# Patient Record
Sex: Male | Born: 1960 | Race: White | Hispanic: No | Marital: Married | Smoking: Former smoker
Health system: Southern US, Community
[De-identification: ages and names within clinical notes are randomized; demographics above are authoritative.]

## PROBLEM LIST (undated history)

## (undated) DIAGNOSIS — F411 Generalized anxiety disorder: Secondary | ICD-10-CM

## (undated) DIAGNOSIS — K760 Fatty (change of) liver, not elsewhere classified: Secondary | ICD-10-CM

## (undated) DIAGNOSIS — J189 Pneumonia, unspecified organism: Secondary | ICD-10-CM

## (undated) DIAGNOSIS — F17200 Nicotine dependence, unspecified, uncomplicated: Secondary | ICD-10-CM

## (undated) DIAGNOSIS — E785 Hyperlipidemia, unspecified: Secondary | ICD-10-CM

## (undated) DIAGNOSIS — J309 Allergic rhinitis, unspecified: Secondary | ICD-10-CM

## (undated) DIAGNOSIS — I2510 Atherosclerotic heart disease of native coronary artery without angina pectoris: Secondary | ICD-10-CM

## (undated) DIAGNOSIS — Z951 Presence of aortocoronary bypass graft: Secondary | ICD-10-CM

## (undated) DIAGNOSIS — I10 Essential (primary) hypertension: Secondary | ICD-10-CM

## (undated) DIAGNOSIS — I4510 Unspecified right bundle-branch block: Secondary | ICD-10-CM

## (undated) DIAGNOSIS — M5126 Other intervertebral disc displacement, lumbar region: Secondary | ICD-10-CM

## (undated) DIAGNOSIS — K219 Gastro-esophageal reflux disease without esophagitis: Secondary | ICD-10-CM

## (undated) HISTORY — DX: Generalized anxiety disorder: F41.1

## (undated) HISTORY — DX: Nicotine dependence, unspecified, uncomplicated: F17.200

## (undated) HISTORY — DX: Atherosclerotic heart disease of native coronary artery without angina pectoris: I25.10

## (undated) HISTORY — DX: Presence of aortocoronary bypass graft: Z95.1

## (undated) HISTORY — DX: Hyperlipidemia, unspecified: E78.5

## (undated) HISTORY — DX: Unspecified right bundle-branch block: I45.10

## (undated) HISTORY — DX: Allergic rhinitis, unspecified: J30.9

## (undated) HISTORY — PX: CARDIAC CATHETERIZATION: SHX172

## (undated) HISTORY — DX: Gastro-esophageal reflux disease without esophagitis: K21.9

---

## 2000-09-10 HISTORY — PX: CORONARY ARTERY BYPASS GRAFT: SHX141

## 2000-10-14 ENCOUNTER — Inpatient Hospital Stay (HOSPITAL_COMMUNITY): Admission: AD | Admit: 2000-10-14 | Discharge: 2000-10-20 | Payer: Self-pay | Admitting: Cardiovascular Disease

## 2000-10-14 ENCOUNTER — Encounter: Payer: Self-pay | Admitting: Cardiology

## 2000-10-15 ENCOUNTER — Encounter: Payer: Self-pay | Admitting: Thoracic Surgery (Cardiothoracic Vascular Surgery)

## 2000-10-16 ENCOUNTER — Encounter: Payer: Self-pay | Admitting: Cardiothoracic Surgery

## 2000-10-16 ENCOUNTER — Encounter: Payer: Self-pay | Admitting: Thoracic Surgery (Cardiothoracic Vascular Surgery)

## 2000-10-17 ENCOUNTER — Encounter: Payer: Self-pay | Admitting: Thoracic Surgery (Cardiothoracic Vascular Surgery)

## 2000-10-23 ENCOUNTER — Encounter: Payer: Self-pay | Admitting: Thoracic Surgery (Cardiothoracic Vascular Surgery)

## 2000-10-23 ENCOUNTER — Encounter
Admission: RE | Admit: 2000-10-23 | Discharge: 2000-10-23 | Payer: Self-pay | Admitting: Thoracic Surgery (Cardiothoracic Vascular Surgery)

## 2000-11-02 ENCOUNTER — Encounter: Payer: Self-pay | Admitting: Emergency Medicine

## 2000-11-02 ENCOUNTER — Inpatient Hospital Stay (HOSPITAL_COMMUNITY): Admission: EM | Admit: 2000-11-02 | Discharge: 2000-11-09 | Payer: Self-pay | Admitting: Emergency Medicine

## 2000-11-03 ENCOUNTER — Encounter: Payer: Self-pay | Admitting: Internal Medicine

## 2000-11-05 ENCOUNTER — Encounter: Payer: Self-pay | Admitting: Internal Medicine

## 2000-11-20 ENCOUNTER — Encounter
Admission: RE | Admit: 2000-11-20 | Discharge: 2000-11-20 | Payer: Self-pay | Admitting: Thoracic Surgery (Cardiothoracic Vascular Surgery)

## 2000-11-20 ENCOUNTER — Encounter: Payer: Self-pay | Admitting: Thoracic Surgery (Cardiothoracic Vascular Surgery)

## 2011-03-12 ENCOUNTER — Other Ambulatory Visit: Payer: Self-pay | Admitting: Medical

## 2011-04-11 ENCOUNTER — Other Ambulatory Visit: Payer: Self-pay | Admitting: Medical

## 2011-04-20 ENCOUNTER — Encounter: Payer: Self-pay | Admitting: Cardiology

## 2011-04-20 ENCOUNTER — Other Ambulatory Visit: Payer: Self-pay | Admitting: *Deleted

## 2011-04-23 ENCOUNTER — Ambulatory Visit: Payer: Self-pay | Admitting: Cardiology

## 2011-06-10 ENCOUNTER — Encounter: Payer: Self-pay | Admitting: Cardiology

## 2011-06-10 DIAGNOSIS — E785 Hyperlipidemia, unspecified: Secondary | ICD-10-CM | POA: Insufficient documentation

## 2011-06-10 DIAGNOSIS — F17200 Nicotine dependence, unspecified, uncomplicated: Secondary | ICD-10-CM | POA: Insufficient documentation

## 2011-06-10 DIAGNOSIS — K219 Gastro-esophageal reflux disease without esophagitis: Secondary | ICD-10-CM | POA: Insufficient documentation

## 2011-06-11 ENCOUNTER — Encounter: Payer: Self-pay | Admitting: Cardiology

## 2011-06-11 ENCOUNTER — Ambulatory Visit (INDEPENDENT_AMBULATORY_CARE_PROVIDER_SITE_OTHER): Payer: Medicaid Other | Admitting: Cardiology

## 2011-06-11 VITALS — BP 139/89 | HR 71 | Ht 74.0 in | Wt 296.0 lb

## 2011-06-11 DIAGNOSIS — E785 Hyperlipidemia, unspecified: Secondary | ICD-10-CM

## 2011-06-11 DIAGNOSIS — I2510 Atherosclerotic heart disease of native coronary artery without angina pectoris: Secondary | ICD-10-CM | POA: Insufficient documentation

## 2011-06-11 DIAGNOSIS — I4510 Unspecified right bundle-branch block: Secondary | ICD-10-CM

## 2011-06-11 DIAGNOSIS — Z951 Presence of aortocoronary bypass graft: Secondary | ICD-10-CM | POA: Insufficient documentation

## 2011-06-11 DIAGNOSIS — F17200 Nicotine dependence, unspecified, uncomplicated: Secondary | ICD-10-CM

## 2011-06-11 NOTE — Progress Notes (Signed)
HPI Patient is seen today to reestablish cardiology care.  The patient underwent cardiac catheterization in 2002.  He underwent bypass surgery by Dr. Dorris Fetch in 2002.  I have tried to find records.  I have located his hospital records from 2002.  One month after his CABG he had community acquired pneumonia.  He had some volume overload and these were both treated.  I have no records since that time.  In addition I have no EKG since that time.  Fortunately the patient has done very well.  He remains quite active.  His original symptom was a sensation of shortness of breath with a quivering feeling in his chest and headache.  He has not had symptoms of this sort since then.   Allergies  Allergen Reactions  . Erythromycin Shortness Of Breath  . Keflex Rash    Current Outpatient Prescriptions  Medication Sig Dispense Refill  . ALPRAZolam (XANAX) 1 MG tablet Take 1 mg by mouth 2 (two) times daily.        Marland Kitchen aspirin 325 MG tablet Take 325 mg by mouth daily.        . cetirizine (ZYRTEC) 10 MG tablet Take 10 mg by mouth daily.        . clopidogrel (PLAVIX) 75 MG tablet Take 75 mg by mouth daily.        Marland Kitchen docusate sodium (COLACE) 100 MG capsule Take 100 mg by mouth daily.        Marland Kitchen doxycycline (MONODOX) 50 MG capsule Take 50 mg by mouth daily.        Marland Kitchen esomeprazole (NEXIUM) 40 MG capsule Take 40 mg by mouth daily before breakfast.        . fluticasone (FLONASE) 50 MCG/ACT nasal spray Place 2 sprays into the nose daily.        . Hydrocodone-Acetaminophen (VICODIN HP) 10-660 MG TABS Take 1 tablet by mouth as directed.        . niacin 500 MG tablet Take 500 mg by mouth daily with breakfast.        . simvastatin (ZOCOR) 40 MG tablet Take 40 mg by mouth at bedtime.          History   Social History  . Marital Status: Single    Spouse Name: N/A    Number of Children: N/A  . Years of Education: N/A   Occupational History  . Not on file.   Social History Main Topics  . Smoking status: Current  Everyday Smoker -- 0.5 packs/day for 32 years    Types: Cigarettes  . Smokeless tobacco: Never Used   Comment: 50 to 60-pack year history of tobacco-STARTED BACK SMOKING  . Alcohol Use: No  . Drug Use: No  . Sexually Active: Not on file   Other Topics Concern  . Not on file   Social History Narrative  . No narrative on file    Family History  Problem Relation Age of Onset  . Heart attack Father 62  . Coronary artery disease      Brother and Sisters hx of CAD  . Cancer Mother     Breast Cancer    Past Medical History  Diagnosis Date  . Dyslipidemia   . CAD (coronary artery disease)   . Tobacco use disorder   . Anxiety state, unspecified   . Esophageal reflux   . Allergic rhinitis, cause unspecified   . Hx of CABG     2002, Dr. Dorris Fetch, X5  . RBBB (right  bundle branch block)     Noted October, 2012    Past Surgical History  Procedure Date  . Coronary artery bypass graft 2002    5 VESSEL   . Cardiac catheterization     ROS  Patient denies fever, chills, headache, sweats, rash, change in vision, change in hearing, chest pain, cough, nausea vomiting, urinary symptoms.  All other systems are reviewed and are negative.   PHYSICAL EXAM Patient is overweight.  He is here with his wife he.  He is oriented to person time and place.  Affect is normal.  Head is atraumatic.  There is no xanthelasma.  No carotid bruits.  There is no jugular venous distention.  Lungs are clear.  Respiratory effort is unlabored.  Cardiac reveals an S1-S2.  No clicks or significant murmurs.  The abdomen is soft.  There is no peripheral edema.  There is no musculoskeletal deformities.  No skin rashes. Filed Vitals:   06/11/11 1300  BP: 139/89  Pulse: 71  Height: 6\' 2"  (1.88 m)  Weight: 296 lb (134.265 kg)    EKG is done today and reviewed by me.  There is right bundle branch block.  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  There is no EKG for comparison. ASSESSMENT &  PLAN

## 2011-06-11 NOTE — Assessment & Plan Note (Signed)
The patient's coronary disease is stable.  He has no significant symptoms.  His surgery was done in 2002.  He has had no significant evaluation since then aware of.  He is on appropriate medications including aspirin and medications for his blood pressure and lipids.  Since it has been 10 years I have recommended to him that we at least reassess his LV function.  I have also recommended a carotid Doppler.  Since he feels fine he wants to have no tests at this time.

## 2011-06-11 NOTE — Assessment & Plan Note (Signed)
The patient's lipids are being treated.  I stressed to him how important it was to continue this treatment.  I be happy to see him back at any time.  I have recommended followup in one year.

## 2011-06-11 NOTE — Assessment & Plan Note (Signed)
I cannot document how long he had right bundle branch block.  This in itself is not an absolute indication for further evaluation

## 2011-06-11 NOTE — Patient Instructions (Signed)
Continue all current medications. Your physician wants you to follow up in:  1 year.  You will receive a reminder letter in the mail one-two months in advance.  If you don't receive a letter, please call our office to schedule the follow up appointment   

## 2011-06-11 NOTE — Assessment & Plan Note (Signed)
Patient continues to smoke. I have counseled him to stop. 

## 2011-07-12 ENCOUNTER — Other Ambulatory Visit: Payer: Self-pay | Admitting: Medical

## 2011-07-12 NOTE — Telephone Encounter (Signed)
Is this okay to refill? 

## 2012-03-13 ENCOUNTER — Encounter (HOSPITAL_COMMUNITY): Payer: Self-pay | Admitting: *Deleted

## 2012-03-13 ENCOUNTER — Emergency Department (HOSPITAL_COMMUNITY)
Admission: EM | Admit: 2012-03-13 | Discharge: 2012-03-13 | Disposition: A | Discharge status: Home / Self Care | Payer: Medicaid Other | Attending: Emergency Medicine | Admitting: Emergency Medicine

## 2012-03-13 ENCOUNTER — Emergency Department (HOSPITAL_COMMUNITY): Payer: Medicaid Other

## 2012-03-13 DIAGNOSIS — J440 Chronic obstructive pulmonary disease with acute lower respiratory infection: Secondary | ICD-10-CM | POA: Insufficient documentation

## 2012-03-13 DIAGNOSIS — E785 Hyperlipidemia, unspecified: Secondary | ICD-10-CM | POA: Insufficient documentation

## 2012-03-13 DIAGNOSIS — J441 Chronic obstructive pulmonary disease with (acute) exacerbation: Secondary | ICD-10-CM

## 2012-03-13 DIAGNOSIS — I2510 Atherosclerotic heart disease of native coronary artery without angina pectoris: Secondary | ICD-10-CM | POA: Insufficient documentation

## 2012-03-13 DIAGNOSIS — Z79899 Other long term (current) drug therapy: Secondary | ICD-10-CM | POA: Insufficient documentation

## 2012-03-13 DIAGNOSIS — F17200 Nicotine dependence, unspecified, uncomplicated: Secondary | ICD-10-CM | POA: Insufficient documentation

## 2012-03-13 DIAGNOSIS — J209 Acute bronchitis, unspecified: Secondary | ICD-10-CM | POA: Insufficient documentation

## 2012-03-13 HISTORY — DX: Pneumonia, unspecified organism: J18.9

## 2012-03-13 MED ORDER — IPRATROPIUM BROMIDE 0.02 % IN SOLN
0.5000 mg | Freq: Once | RESPIRATORY_TRACT | Status: AC
  Administered 2012-03-13: 0.5 mg via RESPIRATORY_TRACT
  Filled 2012-03-13: qty 2.5

## 2012-03-13 MED ORDER — PREDNISONE 10 MG PO TABS: ORAL_TABLET | ORAL | Status: DC

## 2012-03-13 MED ORDER — LEVOFLOXACIN 500 MG PO TABS: 500.0000 mg | ORAL_TABLET | Freq: Every day | ORAL | Status: AC

## 2012-03-13 MED ORDER — ALBUTEROL SULFATE HFA 108 (90 BASE) MCG/ACT IN AERS: 2.0000 | INHALATION_SPRAY | RESPIRATORY_TRACT | Status: DC | PRN

## 2012-03-13 MED ORDER — PREDNISONE 20 MG PO TABS
60.0000 mg | ORAL_TABLET | Freq: Once | ORAL | Status: AC
  Administered 2012-03-13: 60 mg via ORAL
  Filled 2012-03-13: qty 3

## 2012-03-13 MED ORDER — ALBUTEROL SULFATE (5 MG/ML) 0.5% IN NEBU
5.0000 mg | INHALATION_SOLUTION | Freq: Once | RESPIRATORY_TRACT | Status: AC
  Administered 2012-03-13: 5 mg via RESPIRATORY_TRACT
  Filled 2012-03-13: qty 1

## 2012-03-13 MED ORDER — AEROCHAMBER Z-STAT PLUS/MEDIUM MISC
1.0000 | Freq: Once | Status: AC
  Administered 2012-03-13: 1

## 2012-03-13 NOTE — ED Notes (Signed)
Pt c/o cough, congestion, wheezing, headache that started two days ago, cough is productive with green sputum production, denies any fever,

## 2012-03-13 NOTE — ED Provider Notes (Cosign Needed)
History    This chart was scribed for Ward Givens, MD, MD by Smitty Pluck. The patient was seen in room APA14 and the patient's care was started at 10:33AM.   CSN: [id number]  Arrival date & time 03/13/12  1008   First MD Initiated Contact with Patient 03/13/12 1016      Chief Complaint  Patient presents with  . Wheezing  . Cough    (Consider location/radiation/quality/duration/timing/severity/associated sxs/prior treatment) The history is provided by the patient.   Brandon Kaiser is a 51 y.o. male who presents to the Emergency Department complaining of moderate productive cough with green sputum onset 2 days ago. Pt reports rhinorrhea with yellow mucus. Symptoms have been constant since onset. There is no radiation. Denies sore throat, earache nausea, vomiting, diarrhea and fever. Pt reports he was on doxycycline due to conjunctivitis. He states he was put on doxycycline in the past for bronchitis and it didn't work.    Pt reports he is a smoker. Pt had by pass 2002.   Dr. Loney Hering in [facility]  Cardiologist is Dr. Stormy Card  Past Medical History  Diagnosis Date  . Dyslipidemia   . CAD (coronary artery disease)   . Tobacco use disorder   . Anxiety state, unspecified   . Esophageal reflux   . Allergic rhinitis, cause unspecified   . Hx of CABG     2002, Dr. Dorris Fetch, X5  . RBBB (right bundle branch block)     Noted October, 2012  . Pneumonia     Past Surgical History  Procedure Date  . Coronary artery bypass graft 2002    5 VESSEL   . Cardiac catheterization     Family History  Problem Relation Age of Onset  . Heart attack Father 51  . Coronary artery disease      Brother and Sisters hx of CAD  . Cancer Mother     Breast Cancer    History  Substance Use Topics  . Smoking status: Current Everyday Smoker -- 0.5 packs/day for 32 years    Types: Cigarettes  . Smokeless tobacco: Never Used   Comment: 50 to 60-pack year history of tobacco-STARTED BACK SMOKING  .  Alcohol Use: No   Lives with spouse On disability after CABG   Review of Systems  All other systems reviewed and are negative.  10 Systems reviewed and all are negative for acute change except as noted in the HPI.    Allergies  Erythromycin and Cephalexin  Home Medications   Current Outpatient Rx  Name Route Sig Dispense Refill  . ALPRAZOLAM 1 MG PO TABS Oral Take 1 mg by mouth 2 (two) times daily.      . ASPIRIN 325 MG PO TABS Oral Take 325 mg by mouth daily.      Marland Kitchen CETIRIZINE HCL 10 MG PO TABS Oral Take 10 mg by mouth daily.      Marland Kitchen CLOPIDOGREL BISULFATE 75 MG PO TABS Oral Take 75 mg by mouth daily.      Marland Kitchen DOCUSATE SODIUM 100 MG PO CAPS Oral Take 100 mg by mouth daily.      Marland Kitchen DOXYCYCLINE MONOHYDRATE 50 MG PO CAPS Oral Take 50 mg by mouth daily.      Marland Kitchen ESOMEPRAZOLE MAGNESIUM 40 MG PO CPDR Oral Take 40 mg by mouth daily before breakfast.      . FLUTICASONE PROPIONATE 50 MCG/ACT NA SUSP Nasal Place 2 sprays into the nose daily.      Marland Kitchen  HYDROCODONE-ACETAMINOPHEN 10-660 MG PO TABS Oral Take 1 tablet by mouth as directed.      Marland Kitchen NIACIN 500 MG PO TABS Oral Take 500 mg by mouth daily with breakfast.      . SIMVASTATIN 40 MG PO TABS Oral Take 40 mg by mouth at bedtime.        BP 141/79  Pulse 76  Temp 98.1 F (36.7 C)  Resp 20  Ht 6\' 3"  (1.905 m)  Wt 300 lb (136.079 kg)  BMI 37.50 kg/m2  SpO2 97%  Vital signs normal    Physical Exam  Nursing note and vitals reviewed. Constitutional: He is oriented to person, place, and time. He appears well-developed and well-nourished. No distress.  HENT:  Head: Normocephalic and atraumatic.  Right Ear: External ear normal.  Left Ear: External ear normal.  Nose: Nose normal.  Mouth/Throat: Oropharynx is clear and moist.  Eyes: Conjunctivae and EOM are normal. Pupils are equal, round, and reactive to light.  Neck: Normal range of motion. Neck supple.  Cardiovascular: Normal rate, regular rhythm and normal heart sounds.     Pulmonary/Chest: He has wheezes (expiratory diffusely ). He has no rales. He exhibits no tenderness.       No retractions   Neurological: He is alert and oriented to person, place, and time.  Skin: Skin is warm and dry.  Psychiatric: He has a normal mood and affect. His behavior is normal.    ED Course  Procedures (including critical care time)   Medications  aerochamber Z-Stat Plus/medium 1 each (not administered)  albuterol (PROVENTIL) (5 MG/ML) 0.5% nebulizer solution 5 mg (5 mg Nebulization Given 03/13/12 1059)  ipratropium (ATROVENT) nebulizer solution 0.5 mg (0.5 mg Nebulization Given 03/13/12 1059)  predniSONE (DELTASONE) tablet 60 mg (60 mg Oral Given 03/13/12 1047)  albuterol (PROVENTIL) (5 MG/ML) 0.5% nebulizer solution 5 mg (5 mg Nebulization Given 03/13/12 1149)  ipratropium (ATROVENT) nebulizer solution 0.5 mg (0.5 mg Nebulization Given 03/13/12 1149)     DIAGNOSTIC STUDIES: Oxygen Saturation is 97% on room air, normal by my interpretation.    COORDINATION OF CARE: 10:37AM EDP discusses pt ED treatment with pt Have discussed stopping smoking .  10:45AM EDP ordered medication: albuterol 5 mg/ml, atrovent 0.5 mg, deltasone 60 mg  11:43AM EDP rechecks pt. Pt reports breathing treatment has relieved symptoms. Pt has diffuse, lower pitch wheezing and rhonchi. EDP is ordering another breathing treatment and xray.   12:52PM EDP rechecks pt and discusses pt xray results. Pt has rare bronchi at bases. Pt walked to xray without SOB. Pt is ready for discharge.    Dg Chest 2 View  03/13/2012  *RADIOLOGY REPORT*  Clinical Data: Cough, congestion, wheezing  CHEST - 2 VIEW  Comparison: None.  Findings: No pneumonia is seen.  There is however peribronchial thickening which may indicate bronchitis.  The heart is mildly enlarged.  Median sternotomy sutures are noted from prior CABG  IMPRESSION: No pneumonia.  Question bronchitis.  Original Report Authenticated By: Juline Patch, M.D.       1. COPD with acute bronchitis   2. COPD exacerbation     New Prescriptions   ALBUTEROL (PROVENTIL HFA;VENTOLIN HFA) 108 (90 BASE) MCG/ACT INHALER    Inhale 2 puffs into the lungs every 4 (four) hours as needed for wheezing.   LEVOFLOXACIN (LEVAQUIN) 500 MG TABLET    Take 1 tablet (500 mg total) by mouth daily.   PREDNISONE (DELTASONE) 10 MG TABLET    Take 3 po QD  x 2d starting tomorrow, then 2 po QD x 3d then 1 po QD x 3d    Plan discharge  Devoria Albe, MD, FACEP   MDM   I personally performed the services described in this documentation, which was scribed in my presence. The recorded information has been reviewed and considered.  Devoria Albe, MD, Armando Gang    Ward Givens, MD 03/13/12 1321

## 2014-02-01 ENCOUNTER — Emergency Department (HOSPITAL_COMMUNITY)
Admission: EM | Admit: 2014-02-01 | Discharge: 2014-02-01 | Disposition: A | Discharge status: Home / Self Care | Payer: Medicaid Other | Attending: Emergency Medicine | Admitting: Emergency Medicine

## 2014-02-01 ENCOUNTER — Encounter (HOSPITAL_COMMUNITY): Payer: Self-pay | Admitting: Emergency Medicine

## 2014-02-01 DIAGNOSIS — Z8719 Personal history of other diseases of the digestive system: Secondary | ICD-10-CM | POA: Insufficient documentation

## 2014-02-01 DIAGNOSIS — Z8701 Personal history of pneumonia (recurrent): Secondary | ICD-10-CM | POA: Insufficient documentation

## 2014-02-01 DIAGNOSIS — J40 Bronchitis, not specified as acute or chronic: Secondary | ICD-10-CM

## 2014-02-01 DIAGNOSIS — IMO0002 Reserved for concepts with insufficient information to code with codable children: Secondary | ICD-10-CM | POA: Insufficient documentation

## 2014-02-01 DIAGNOSIS — Z951 Presence of aortocoronary bypass graft: Secondary | ICD-10-CM | POA: Insufficient documentation

## 2014-02-01 DIAGNOSIS — F411 Generalized anxiety disorder: Secondary | ICD-10-CM | POA: Insufficient documentation

## 2014-02-01 DIAGNOSIS — F17200 Nicotine dependence, unspecified, uncomplicated: Secondary | ICD-10-CM | POA: Insufficient documentation

## 2014-02-01 DIAGNOSIS — I2510 Atherosclerotic heart disease of native coronary artery without angina pectoris: Secondary | ICD-10-CM | POA: Insufficient documentation

## 2014-02-01 DIAGNOSIS — Z7982 Long term (current) use of aspirin: Secondary | ICD-10-CM | POA: Insufficient documentation

## 2014-02-01 DIAGNOSIS — J209 Acute bronchitis, unspecified: Secondary | ICD-10-CM | POA: Insufficient documentation

## 2014-02-01 DIAGNOSIS — J029 Acute pharyngitis, unspecified: Secondary | ICD-10-CM | POA: Insufficient documentation

## 2014-02-01 DIAGNOSIS — Z79899 Other long term (current) drug therapy: Secondary | ICD-10-CM | POA: Insufficient documentation

## 2014-02-01 DIAGNOSIS — M129 Arthropathy, unspecified: Secondary | ICD-10-CM | POA: Insufficient documentation

## 2014-02-01 DIAGNOSIS — I4510 Unspecified right bundle-branch block: Secondary | ICD-10-CM | POA: Insufficient documentation

## 2014-02-01 LAB — RAPID STREP SCREEN (MED CTR MEBANE ONLY): Streptococcus, Group A Screen (Direct): NEGATIVE

## 2014-02-01 MED ORDER — DOXYCYCLINE HYCLATE 100 MG PO TABS
100.0000 mg | ORAL_TABLET | Freq: Once | ORAL | Status: AC
  Administered 2014-02-01: 100 mg via ORAL
  Filled 2014-02-01: qty 1

## 2014-02-01 MED ORDER — PREDNISONE 10 MG PO TABS: 20.0000 mg | ORAL_TABLET | Freq: Every day | ORAL | Status: DC

## 2014-02-01 MED ORDER — DOXYCYCLINE HYCLATE 100 MG PO CAPS: 100.0000 mg | ORAL_CAPSULE | Freq: Two times a day (BID) | ORAL | Status: DC

## 2014-02-01 MED ORDER — ALBUTEROL SULFATE HFA 108 (90 BASE) MCG/ACT IN AERS
2.0000 | INHALATION_SPRAY | Freq: Once | RESPIRATORY_TRACT | Status: AC
  Administered 2014-02-01: 2 via RESPIRATORY_TRACT
  Filled 2014-02-01: qty 6.7

## 2014-02-01 NOTE — ED Notes (Signed)
Sore throat, cough with clear sputum since Saturday, "low grade fever".

## 2014-02-01 NOTE — Discharge Instructions (Signed)
Please increase fluids. Please use salt water gargles for your throat. Please use the mask until symptoms have resolved to prevent spread of the infection. It is very important that you stop smoking. Please use albuterol 2 puffs every 4 hours. Please use doxycycline 2 times daily until all taken. Tylenol or ibuprofen every 4 hours for fever or aching. Use the prednisone taper as prescribed. Please see your primary physician for recheck, or return to the emergency department  Bronchitis Bronchitis is swelling (inflammation) of the air tubes leading to your lungs (bronchi). This causes mucus and a cough. If the swelling gets bad, you may have trouble breathing. HOME CARE   Rest.  Drink enough fluids to keep your pee (urine) clear or pale yellow (unless you have a condition where you have to watch how much you drink).  Only take medicine as told by your doctor. If you were given antibiotic medicines, finish them even if you start to feel better.  Avoid smoke, irritating chemicals, and strong smells. These make the problem worse. Quit smoking if you smoke. This helps your lungs heal faster.  Use a cool mist humidifier. Change the water in the humidifier every day. You can also sit in the bathroom with hot shower running for 5 10 minutes. Keep the door closed.  See your health care provider as told.  Wash your hands often. GET HELP IF: Your problems do not get better after 1 week. GET HELP RIGHT AWAY IF:   Your fever gets worse.  You have chills.  Your chest hurts.  Your problems breathing get worse.  You have blood in your mucus.  You pass out (faint).  You feel lightheaded.  You have a bad headache.  You throw up (vomit) again and again. MAKE SURE YOU:  Understand these instructions.  Will watch your condition.  Will get help right away if you are not doing well or get worse. Document Released: 02/13/2008 Document Revised: 06/17/2013 Document Reviewed: 04/21/2013 [facility]  Patient Information 2014 [place], [place].  Pharyngitis Pharyngitis is a sore throat (pharynx). There is redness, pain, and swelling of your throat. HOME CARE   Drink enough fluids to keep your pee (urine) clear or pale yellow.  Only take medicine as told by your doctor.  You may get sick again if you do not take medicine as told. Finish your medicines, even if you start to feel better.  Do not take aspirin.  Rest.  Rinse your mouth (gargle) with salt water ( tsp of salt per 1 qt of water) every 1 2 hours. This will help the pain.  If you are not at risk for choking, you can suck on hard candy or sore throat lozenges. GET HELP IF:  You have large, tender lumps on your neck.  You have a rash.  You cough up green, yellow-brown, or bloody spit. GET HELP RIGHT AWAY IF:   You have a stiff neck.  You drool or cannot swallow liquids.  You throw up (vomit) or are not able to keep medicine or liquids down.  You have very bad pain that does not go away with medicine.  You have problems breathing (not from a stuffy nose). MAKE SURE YOU:   Understand these instructions.  Will watch your condition.  Will get help right away if you are not doing well or get worse. Document Released: 02/13/2008 Document Revised: 06/17/2013 Document Reviewed: 05/04/2013 [facility] Patient Information 2014 [place], [place]. if not improving

## 2014-02-01 NOTE — ED Provider Notes (Signed)
CSN: [id number]     Arrival date & time 02/01/14  1107 History  This chart was scribed for non-physician practitioner Lyla Son. Danae Orleans,  working with Glynn Octave, MD, by Yevette Edwards, ED Scribe. This patient was seen in room APFT23/APFT23 and the patient's care was started at 2:20 PM.   None    Chief Complaint  Patient presents with  . Sore Throat    The history is provided by the patient. No language interpreter was used.   HPI Comments: Brandon Kaiser is a 53 y.o. male who presents to the Emergency Department complaining of a sore throat which has persisted for three days and has been associated with a cough productive of clear sputum and a low-grade fever. In the ED, the pt's temperature is 99 F. He is able to drink normally. He denies a h/o operations or procedures to his throat. He has a h/o pneumonia. The pt is a current smoker.   Past Medical History  Diagnosis Date  . Dyslipidemia   . CAD (coronary artery disease)   . Tobacco use disorder   . Anxiety state, unspecified   . Esophageal reflux   . Allergic rhinitis, cause unspecified   . Hx of CABG     2002, Dr. Dorris Fetch, X5  . RBBB (right bundle branch block)     Noted October, 2012  . Pneumonia    Past Surgical History  Procedure Laterality Date  . Coronary artery bypass graft  2002    5 VESSEL   . Cardiac catheterization     Family History  Problem Relation Age of Onset  . Heart attack Father 3  . Coronary artery disease      Brother and Sisters hx of CAD  . Cancer Mother     Breast Cancer   History  Substance Use Topics  . Smoking status: Current Every Day Smoker -- 0.50 packs/day for 32 years    Types: Cigarettes  . Smokeless tobacco: Never Used     Comment: 50 to 60-pack year history of tobacco-STARTED BACK SMOKING  . Alcohol Use: No    Review of Systems  Constitutional: Positive for fever.  HENT: Positive for sore throat.   Respiratory: Positive for cough.   All other systems reviewed  and are negative.  Allergies  Erythromycin and Cephalexin  Home Medications   Prior to Admission medications   Medication Sig Start Date End Date Taking? Authorizing Provider  albuterol (PROVENTIL HFA;VENTOLIN HFA) 108 (90 BASE) MCG/ACT inhaler Inhale 2 puffs into the lungs every 4 (four) hours as needed for wheezing. 03/13/12 03/13/13  Ward Givens, MD  ALPRAZolam Prudy Feeler) 1 MG tablet Take 1 mg by mouth 2 (two) times daily.      Historical Provider, MD  aspirin 325 MG tablet Take 325 mg by mouth daily.      Historical Provider, MD  Aspirin-Acetaminophen-Caffeine (GOODYS EXTRA STRENGTH) [phone] MG PACK Take 1 packet by mouth every 6 (six) hours as needed. Pain    Historical Provider, MD  cetirizine (ZYRTEC) 10 MG tablet Take 10 mg by mouth daily.      Historical Provider, MD  clopidogrel (PLAVIX) 75 MG tablet Take 75 mg by mouth daily.      Historical Provider, MD  docusate sodium (COLACE) 100 MG capsule Take 100 mg by mouth daily.      Historical Provider, MD  fluticasone (FLONASE) 50 MCG/ACT nasal spray Place 1 spray into the nose daily.     Historical Provider, MD  Hydrocodone-Acetaminophen (VICODIN HP) 10-660 MG TABS Take 0.5 tablets by mouth 4 (four) times daily.     Historical Provider, MD  niacin 500 MG tablet Take 500 mg by mouth daily with breakfast.      Historical Provider, MD  Olopatadine HCl (PATADAY) 0.2 % SOLN Place 1 drop into both eyes daily.    Historical Provider, MD  predniSONE (DELTASONE) 10 MG tablet Take 3 po QD x 2d starting tomorrow, then 2 po QD x 3d then 1 po QD x 3d 03/13/12   Ward GivensIva L Knapp, MD  simvastatin (ZOCOR) 40 MG tablet Take 40 mg by mouth at bedtime.      Historical Provider, MD   Triage Vitals: BP 137/76  Pulse 78  Temp(Src) 99 F (37.2 C) (Oral)  Resp 18  Ht 6' (1.829 m)  Wt 304 lb (137.893 kg)  BMI 41.22 kg/m2  SpO2 97%  Physical Exam  Nursing note and vitals reviewed. Constitutional: He is oriented to person, place, and time. He appears  well-developed and well-nourished. No distress.  HENT:  Head: Normocephalic and atraumatic.  Right Ear: Tympanic membrane and external ear normal.  Left Ear: Tympanic membrane and external ear normal.  Uvula is enlarged and midline. Increased redness to pharynx.  Airway is patent.  No pain to percussion over the sinuses.  Mild nasal congestion.   Eyes: EOM are normal.  Neck: Neck supple. No tracheal deviation present.  Trachea midline.   Cardiovascular: Normal rate, regular rhythm and normal heart sounds.   No murmur heard. Pulmonary/Chest: Effort normal. No respiratory distress. He has wheezes.  Bilateral rhonchi. Expiratory wheezes.   Musculoskeletal: Normal range of motion.  Neurological: He is alert and oriented to person, place, and time.  Skin: Skin is warm and dry.  Psychiatric: He has a normal mood and affect. His behavior is normal.    ED Course  Procedures (including critical care time)  DIAGNOSTIC STUDIES: Oxygen Saturation is 97% on room air, normal by my interpretation.    COORDINATION OF CARE:  2:25 PM- Discussed treatment plan with patient, and the patient agreed to the plan. The plan includes an antibiotic prescription and a steroid treatment. Advised the pt to cease smoking.   Labs Review Labs Reviewed  RAPID STREP SCREEN  CULTURE, GROUP A STREP    Imaging Review No results found.   EKG Interpretation None      MDM Pt noted to have bilat wheezes/rhonchi and increase redness of the throat. Rx doxycycline and prednisone given. Pt to use salt water gargles. Pt will return if any changes or problem.     Final diagnoses:  None    **I have reviewed nursing notes, vital signs, and all appropriate lab and imaging results for this patient.*  **I personally performed the services described in this documentation, which was scribed in my presence. The recorded information has been reviewed and is accurate.Kathie Dike*   Jhordan Mckibben M Nary Sneed, PA-C 02/01/14 1818

## 2014-02-01 NOTE — ED Notes (Signed)
Pt c/o productive cough with thin, clear, sputum, congestion, and sore throat for last few days. Throat red with some patchy white areas noted.

## 2014-02-02 NOTE — ED Provider Notes (Signed)
Medical screening examination/treatment/procedure(s) were performed by non-physician practitioner and as supervising physician I was immediately available for consultation/collaboration.   EKG Interpretation None        Zillah Alexie, MD 02/02/14 0705 

## 2014-02-03 LAB — CULTURE, GROUP A STREP

## 2014-03-03 ENCOUNTER — Inpatient Hospital Stay (HOSPITAL_COMMUNITY)
Admission: EM | Admit: 2014-03-03 | Discharge: 2014-03-07 | DRG: 604 | Disposition: A | Discharge status: Home / Self Care | Payer: Medicaid Other | Attending: Internal Medicine | Admitting: Internal Medicine

## 2014-03-03 ENCOUNTER — Encounter (HOSPITAL_COMMUNITY): Payer: Self-pay | Admitting: Emergency Medicine

## 2014-03-03 ENCOUNTER — Emergency Department (HOSPITAL_COMMUNITY): Payer: Medicaid Other

## 2014-03-03 DIAGNOSIS — K760 Fatty (change of) liver, not elsewhere classified: Secondary | ICD-10-CM

## 2014-03-03 DIAGNOSIS — T481X5A Adverse effect of skeletal muscle relaxants [neuromuscular blocking agents], initial encounter: Secondary | ICD-10-CM | POA: Diagnosis not present

## 2014-03-03 DIAGNOSIS — I25118 Atherosclerotic heart disease of native coronary artery with other forms of angina pectoris: Secondary | ICD-10-CM

## 2014-03-03 DIAGNOSIS — J9600 Acute respiratory failure, unspecified whether with hypoxia or hypercapnia: Secondary | ICD-10-CM | POA: Diagnosis not present

## 2014-03-03 DIAGNOSIS — I2583 Coronary atherosclerosis due to lipid rich plaque: Secondary | ICD-10-CM

## 2014-03-03 DIAGNOSIS — M519 Unspecified thoracic, thoracolumbar and lumbosacral intervertebral disc disorder: Secondary | ICD-10-CM | POA: Diagnosis present

## 2014-03-03 DIAGNOSIS — E860 Dehydration: Secondary | ICD-10-CM | POA: Diagnosis present

## 2014-03-03 DIAGNOSIS — Z7982 Long term (current) use of aspirin: Secondary | ICD-10-CM

## 2014-03-03 DIAGNOSIS — F17200 Nicotine dependence, unspecified, uncomplicated: Secondary | ICD-10-CM | POA: Diagnosis present

## 2014-03-03 DIAGNOSIS — N179 Acute kidney failure, unspecified: Secondary | ICD-10-CM | POA: Diagnosis present

## 2014-03-03 DIAGNOSIS — X58XXXA Exposure to other specified factors, initial encounter: Secondary | ICD-10-CM

## 2014-03-03 DIAGNOSIS — Z951 Presence of aortocoronary bypass graft: Secondary | ICD-10-CM

## 2014-03-03 DIAGNOSIS — J441 Chronic obstructive pulmonary disease with (acute) exacerbation: Secondary | ICD-10-CM | POA: Diagnosis not present

## 2014-03-03 DIAGNOSIS — S301XXA Contusion of abdominal wall, initial encounter: Secondary | ICD-10-CM

## 2014-03-03 DIAGNOSIS — M5136 Other intervertebral disc degeneration, lumbar region: Secondary | ICD-10-CM

## 2014-03-03 DIAGNOSIS — K7689 Other specified diseases of liver: Secondary | ICD-10-CM | POA: Diagnosis present

## 2014-03-03 DIAGNOSIS — S301XXS Contusion of abdominal wall, sequela: Secondary | ICD-10-CM

## 2014-03-03 DIAGNOSIS — R109 Unspecified abdominal pain: Secondary | ICD-10-CM

## 2014-03-03 DIAGNOSIS — F411 Generalized anxiety disorder: Secondary | ICD-10-CM | POA: Diagnosis present

## 2014-03-03 DIAGNOSIS — Z6838 Body mass index (BMI) 38.0-38.9, adult: Secondary | ICD-10-CM

## 2014-03-03 DIAGNOSIS — K219 Gastro-esophageal reflux disease without esophagitis: Secondary | ICD-10-CM | POA: Diagnosis present

## 2014-03-03 DIAGNOSIS — E785 Hyperlipidemia, unspecified: Secondary | ICD-10-CM | POA: Diagnosis present

## 2014-03-03 DIAGNOSIS — I2510 Atherosclerotic heart disease of native coronary artery without angina pectoris: Secondary | ICD-10-CM | POA: Diagnosis present

## 2014-03-03 DIAGNOSIS — Z7902 Long term (current) use of antithrombotics/antiplatelets: Secondary | ICD-10-CM

## 2014-03-03 DIAGNOSIS — M62838 Other muscle spasm: Secondary | ICD-10-CM | POA: Diagnosis present

## 2014-03-03 DIAGNOSIS — I10 Essential (primary) hypertension: Secondary | ICD-10-CM | POA: Diagnosis present

## 2014-03-03 DIAGNOSIS — T148XXA Other injury of unspecified body region, initial encounter: Secondary | ICD-10-CM

## 2014-03-03 DIAGNOSIS — Z8249 Family history of ischemic heart disease and other diseases of the circulatory system: Secondary | ICD-10-CM

## 2014-03-03 DIAGNOSIS — M5126 Other intervertebral disc displacement, lumbar region: Secondary | ICD-10-CM | POA: Diagnosis present

## 2014-03-03 DIAGNOSIS — Z79899 Other long term (current) drug therapy: Secondary | ICD-10-CM

## 2014-03-03 DIAGNOSIS — Z803 Family history of malignant neoplasm of breast: Secondary | ICD-10-CM

## 2014-03-03 DIAGNOSIS — E669 Obesity, unspecified: Secondary | ICD-10-CM | POA: Diagnosis present

## 2014-03-03 DIAGNOSIS — I4510 Unspecified right bundle-branch block: Secondary | ICD-10-CM

## 2014-03-03 HISTORY — DX: Essential (primary) hypertension: I10

## 2014-03-03 HISTORY — DX: Other intervertebral disc displacement, lumbar region: M51.26

## 2014-03-03 HISTORY — DX: Fatty (change of) liver, not elsewhere classified: K76.0

## 2014-03-03 LAB — CBC WITH DIFFERENTIAL/PLATELET
BASOS ABS: 0 10*3/uL (ref 0.0–0.1)
Basophils Relative: 0 % (ref 0–1)
Eosinophils Absolute: 0 10*3/uL (ref 0.0–0.7)
Eosinophils Relative: 0 % (ref 0–5)
HEMATOCRIT: 46.8 % (ref 39.0–52.0)
HEMOGLOBIN: 15.7 g/dL (ref 13.0–17.0)
LYMPHS PCT: 12 % (ref 12–46)
Lymphs Abs: 1.4 10*3/uL (ref 0.7–4.0)
MCH: 32 pg (ref 26.0–34.0)
MCHC: 33.5 g/dL (ref 30.0–36.0)
MCV: 95.3 fL (ref 78.0–100.0)
MONO ABS: 0.5 10*3/uL (ref 0.1–1.0)
MONOS PCT: 4 % (ref 3–12)
NEUTROS ABS: 9.6 10*3/uL — AB (ref 1.7–7.7)
NEUTROS PCT: 84 % — AB (ref 43–77)
Platelets: 195 10*3/uL (ref 150–400)
RBC: 4.91 MIL/uL (ref 4.22–5.81)
RDW: 13.7 % (ref 11.5–15.5)
WBC: 11.5 10*3/uL — AB (ref 4.0–10.5)

## 2014-03-03 LAB — TROPONIN I: Troponin I: <0.3 ng/mL (ref <0.30)

## 2014-03-03 LAB — COMPREHENSIVE METABOLIC PANEL
ALK PHOS: 46 U/L (ref 39–117)
ALT: 43 U/L (ref 0–53)
AST: 32 U/L (ref 0–37)
Albumin: 3.5 g/dL (ref 3.5–5.2)
BILIRUBIN TOTAL: 0.5 mg/dL (ref 0.3–1.2)
BUN: 17 mg/dL (ref 6–23)
CHLORIDE: 100 meq/L (ref 96–112)
CO2: 26 meq/L (ref 19–32)
CREATININE: 0.88 mg/dL (ref 0.50–1.35)
Calcium: 9.2 mg/dL (ref 8.4–10.5)
GFR calc Af Amer: >90 mL/min (ref >90)
Glucose, Bld: 159 mg/dL — ABNORMAL HIGH (ref 70–99)
POTASSIUM: 5.3 meq/L (ref 3.7–5.3)
Sodium: 137 mEq/L (ref 137–147)
Total Protein: 6.8 g/dL (ref 6.0–8.3)

## 2014-03-03 LAB — LIPASE, BLOOD: Lipase: 31 U/L (ref 11–59)

## 2014-03-03 MED ORDER — SODIUM CHLORIDE 0.9 % IV BOLUS (SEPSIS)
1000.0000 mL | Freq: Once | INTRAVENOUS | Status: AC
  Administered 2014-03-03: 1000 mL via INTRAVENOUS

## 2014-03-03 MED ORDER — ONDANSETRON HCL 4 MG/2ML IJ SOLN
4.0000 mg | Freq: Once | INTRAMUSCULAR | Status: AC
  Administered 2014-03-03: 4 mg via INTRAVENOUS
  Filled 2014-03-03: qty 2

## 2014-03-03 MED ORDER — MORPHINE SULFATE 4 MG/ML IJ SOLN
4.0000 mg | Freq: Once | INTRAMUSCULAR | Status: AC
  Administered 2014-03-03: 4 mg via INTRAVENOUS
  Filled 2014-03-03: qty 1

## 2014-03-03 MED ORDER — IOHEXOL 300 MG/ML  SOLN
50.0000 mL | Freq: Once | INTRAMUSCULAR | Status: AC | PRN
  Administered 2014-03-03: 50 mL via ORAL

## 2014-03-03 MED ORDER — IOHEXOL 300 MG/ML  SOLN
100.0000 mL | Freq: Once | INTRAMUSCULAR | Status: AC | PRN
  Administered 2014-03-03: 100 mL via INTRAVENOUS

## 2014-03-03 NOTE — ED Notes (Signed)
Patient complaining of severe left lower quadrant abdominal pain. Reports sneezed tonight and felt something pop in that area. Also reports swollen area to left upper abdomen. Patient very diaphoretic, clammy, and pale in triage.

## 2014-03-03 NOTE — ED Provider Notes (Signed)
This chart was scribed for Brandon MawKristen N Sharmaine Bain, DO, by Yevette EdwardsAngela Bracken, ED Scribe. This patient was seen in room APA09/APA09.  TIME SEEN: 9:45 PM  CHIEF COMPLAINT: Abdominal Pain  HPI:  Brandon MausRandy Kaiser is a 53 y.o. male, with a h/o CAD STATUS post CABG and RBBB, who presents to the Emergency Department complaining of sudden-onset left lower quadrant abdominal pain which began approximately three hours ago after sneezing. He states he felt something pop. He also reports diaphoresis, chills, and a cough. His wife reports he has had cramping to his legs and flank intermittently for the past month. He denies nausea, emesis, diarrhea, hematochezia, dark stool or melena, dysuria, or hematuria. He denies chest pain or SOB associated with the abdominal pain. He also denies a h/o abdominal surgery. The pain is increased by being supine, but he denies that anything improves the pain. No radiation of the pain.   ROS: See HPI Constitutional: positive diaphoresis, positive chills, no fever  Eyes: no drainage  ENT: positive sneezing, no runny nose   Cardiovascular:  no chest pain  Resp: positive cough, no SOB  GI: positive abdominal pain, no nausea, no vomiting, no diarrhea, no hematochezia, no dark stools GU: no dysuria, no hematuria  Integumentary: no rash  Allergy: no hives  Musculoskeletal: positive cramping to legs and flank, no leg swelling  Neurological: no slurred speech ROS otherwise negative  PAST MEDICAL HISTORY/PAST SURGICAL HISTORY:  Past Medical History  Diagnosis Date  . Dyslipidemia   . CAD (coronary artery disease)   . Tobacco use disorder   . Anxiety state, unspecified   . Esophageal reflux   . Allergic rhinitis, cause unspecified   . Hx of CABG     2002, Dr. Dorris FetchHendrickson, [place]  . RBBB (right bundle branch block)     Noted October, 2012  . Pneumonia     MEDICATIONS:  Prior to Admission medications   Medication Sig Start Date End Date Taking? Authorizing Provider  albuterol  (PROVENTIL HFA;VENTOLIN HFA) 108 (90 BASE) MCG/ACT inhaler Inhale 2 puffs into the lungs every 4 (four) hours as needed for wheezing. 03/13/12 03/13/13  Haik Mahoney GivensIva L Knapp, MD  ALPRAZolam Prudy Feeler(XANAX) 1 MG tablet Take 1 mg by mouth 2 (two) times daily.      Historical Provider, MD  aspirin EC 325 MG tablet Take 325 mg by mouth every morning.    Historical Provider, MD  cetirizine (ZYRTEC) 10 MG tablet Take 10 mg by mouth every morning.     Historical Provider, MD  clopidogrel (PLAVIX) 75 MG tablet Take 75 mg by mouth at bedtime.     Historical Provider, MD  Dextromethorphan Polistirex (EQ COUGH DM PO) Take 5-10 mLs by mouth as needed (for cough).    Historical Provider, MD  docusate sodium (COLACE) 100 MG capsule Take 100 mg by mouth at bedtime.     Historical Provider, MD  doxycycline (VIBRAMYCIN) 100 MG capsule Take 1 capsule (100 mg total) by mouth 2 (two) times daily. 02/01/14   Kathie DikeHobson M Bryant, PA-C  fluticasone (FLONASE) 50 MCG/ACT nasal spray Place 1 spray into the nose daily as needed for allergies.     Historical Provider, MD  HYDROcodone-acetaminophen (NORCO) 10-325 MG per tablet Take 0.5 tablets by mouth 4 (four) times daily.    Historical Provider, MD  lisinopril (PRINIVIL,ZESTRIL) 20 MG tablet Take 20 mg by mouth every morning.    Historical Provider, MD  niacin 500 MG tablet Take 500 mg by mouth daily with breakfast.  Historical Provider, MD  Olopatadine HCl (PATADAY) 0.2 % SOLN Place 1 drop into both eyes daily.    Historical Provider, MD  predniSONE (DELTASONE) 10 MG tablet Take 2 tablets (20 mg total) by mouth daily. 02/01/14   Kathie DikeHobson M Bryant, PA-C  simvastatin (ZOCOR) 40 MG tablet Take 40 mg by mouth at bedtime.      Historical Provider, MD    ALLERGIES:  Allergies  Allergen Reactions  . Erythromycin Shortness Of Breath  . Cephalexin Rash    SOCIAL HISTORY:  History  Substance Use Topics  . Smoking status: Current Every Day Smoker -- 0.50 packs/day for 32 years    Types: Cigarettes   . Smokeless tobacco: Never Used     Comment: 50 to 60-pack year history of tobacco-STARTED BACK SMOKING  . Alcohol Use: No    FAMILY HISTORY: Family History  Problem Relation Age of Onset  . Heart attack Father 5350  . Coronary artery disease      Brother and Sisters hx of CAD  . Cancer Mother     Breast Cancer    EXAM: Triage Vitals: BP 116/72  Pulse 92  Temp(Src) 97.9 F (36.6 C) (Oral)  Resp 26  Ht 6\' 3"  (1.905 m)  Wt 305 lb (138.347 kg)  BMI 38.12 kg/m2  SpO2 96%  CONSTITUTIONAL: Alert and oriented and responds appropriately to questions. Well-appearing; well-nourished; appears uncomfortable, diaphoretic HEAD: Normocephalic EYES: Conjunctivae clear, PERRL ENT: normal nose; no rhinorrhea; moist mucous membranes; pharynx without lesions noted NECK: Supple, no meningismus, no LAD  CARD: RRR; S1 and S2 appreciated; no murmurs, no clicks, no rubs, no gallops RESP: Normal chest excursion without splinting or tachypnea; breath sounds clear and equal bilaterally; no wheezes, no rhonchi, no rales, patient is extremely tender to palpation over his anterior and lateral left ribs with no crepitus or deformity ABD/GI: Normal bowel sounds; non-distended; patient does have some soft tissue swelling in the left upper quadrant it is extremely tender to palpation diffusely in the left side of the abdomen with no peritoneal signs, patient is a voluntary guarding but no rebound, no ecchymosis or crepitus or induration or warmth or erythema over this area BACK:  The back appears normal and is non-tender to palpation, there is no CVA tenderness EXT: Normal ROM in all joints; non-tender to palpation; no edema; normal capillary refill; no cyanosis    SKIN: Normal color for age and race; warm, diaphoretic NEURO: Moves all extremities equally PSYCH: The patient's mood and manner are appropriate. Grooming and personal hygiene are appropriate.  MEDICAL DECISION MAKING: He should here with left upper  quadrant abdominal pain after sneezing tonight. Differential diagnosis includes rib fractures, colitis, splenic injury, pancreatitis. Will obtain abdominal labs, troponin EKG given patient has a cardiac history and is diaphoretic although his pain is reproducible with palpation, urine. Will obtain a CT of his abdomen pelvis and left rib x-rays. We'll give pain and nausea medicine.  ED PROGRESS: Patient looks much better after pain medication. He has a mild leukocytosis but no other abnormalities in his labs. CT scan pending.   12:42 AM  Pt's pain is controlled but he is still uncomfortable. CT scan shows a large 22 cm x 14 cm x 7 cm rectus sheath hematoma. No obvious signs of active bleeding. We'll repeat H./H. His initial hemoglobin was 15.7. He is not on anticoagulation. Platelets normal. Coags normal. We'll discuss with general surgery.   12:52 AM  Spoke with Dr. Lovell SheehanJenkins with general surgery who  reports there is nothing to be done surgically at this time. He agrees with admission to medicine for observation, repeat H&H and pain control. Also recommends applying ice packs to this area. Patient and family updated with plan and agree. We'll give second dose of IV morphine.    EKG Interpretation  Date/Time:  Wednesday March 03 2014 21:41:04 EDT Ventricular Rate:  89 PR Interval:  155 QRS Duration: 142 QT Interval:  410 QTC Calculation: 499 R Axis:   61 Text Interpretation:  Sinus rhythm Right bundle branch block Inferior infarct, old Anterolateral infarct, age indeterminate Confirmed by Eisa Necaise,  DO, [place] ([id number]) on 03/03/2014 10:23:52 PM           Brandon Maw Maretta Overdorf, DO 03/04/14 1610

## 2014-03-04 ENCOUNTER — Encounter (HOSPITAL_COMMUNITY): Payer: Self-pay

## 2014-03-04 DIAGNOSIS — I10 Essential (primary) hypertension: Secondary | ICD-10-CM | POA: Diagnosis present

## 2014-03-04 DIAGNOSIS — S301XXA Contusion of abdominal wall, initial encounter: Principal | ICD-10-CM

## 2014-03-04 DIAGNOSIS — M51369 Other intervertebral disc degeneration, lumbar region without mention of lumbar back pain or lower extremity pain: Secondary | ICD-10-CM

## 2014-03-04 DIAGNOSIS — K760 Fatty (change of) liver, not elsewhere classified: Secondary | ICD-10-CM

## 2014-03-04 DIAGNOSIS — F17200 Nicotine dependence, unspecified, uncomplicated: Secondary | ICD-10-CM

## 2014-03-04 DIAGNOSIS — E785 Hyperlipidemia, unspecified: Secondary | ICD-10-CM

## 2014-03-04 DIAGNOSIS — R109 Unspecified abdominal pain: Secondary | ICD-10-CM

## 2014-03-04 DIAGNOSIS — I2510 Atherosclerotic heart disease of native coronary artery without angina pectoris: Secondary | ICD-10-CM

## 2014-03-04 DIAGNOSIS — T148XXA Other injury of unspecified body region, initial encounter: Secondary | ICD-10-CM | POA: Insufficient documentation

## 2014-03-04 DIAGNOSIS — M5136 Other intervertebral disc degeneration, lumbar region: Secondary | ICD-10-CM

## 2014-03-04 DIAGNOSIS — M5126 Other intervertebral disc displacement, lumbar region: Secondary | ICD-10-CM

## 2014-03-04 DIAGNOSIS — I209 Angina pectoris, unspecified: Secondary | ICD-10-CM

## 2014-03-04 HISTORY — DX: Other intervertebral disc degeneration, lumbar region without mention of lumbar back pain or lower extremity pain: M51.369

## 2014-03-04 HISTORY — DX: Other intervertebral disc displacement, lumbar region: M51.26

## 2014-03-04 HISTORY — DX: Other intervertebral disc degeneration, lumbar region: M51.36

## 2014-03-04 HISTORY — DX: Fatty (change of) liver, not elsewhere classified: K76.0

## 2014-03-04 LAB — BASIC METABOLIC PANEL
BUN: 15 mg/dL (ref 6–23)
CALCIUM: 8.7 mg/dL (ref 8.4–10.5)
CO2: 29 mEq/L (ref 19–32)
CREATININE: 0.76 mg/dL (ref 0.50–1.35)
Chloride: 98 mEq/L (ref 96–112)
GLUCOSE: 107 mg/dL — AB (ref 70–99)
Potassium: 5.1 mEq/L (ref 3.7–5.3)
Sodium: 136 mEq/L — ABNORMAL LOW (ref 137–147)

## 2014-03-04 LAB — HEMOGLOBIN AND HEMATOCRIT, BLOOD
HCT: 40.5 % (ref 39.0–52.0)
HCT: 41 % (ref 39.0–52.0)
HCT: 43.4 % (ref 39.0–52.0)
HEMOGLOBIN: 13.4 g/dL (ref 13.0–17.0)
Hemoglobin: 13.6 g/dL (ref 13.0–17.0)
Hemoglobin: 14.6 g/dL (ref 13.0–17.0)

## 2014-03-04 LAB — TYPE AND SCREEN
ABO/RH(D): A POS
ANTIBODY SCREEN: NEGATIVE

## 2014-03-04 LAB — CBC
HCT: 42.8 % (ref 39.0–52.0)
Hemoglobin: 14.2 g/dL (ref 13.0–17.0)
MCH: 31.8 pg (ref 26.0–34.0)
MCHC: 33.2 g/dL (ref 30.0–36.0)
MCV: 96 fL (ref 78.0–100.0)
Platelets: 203 10*3/uL (ref 150–400)
RBC: 4.46 MIL/uL (ref 4.22–5.81)
RDW: 13.8 % (ref 11.5–15.5)
WBC: 13.3 10*3/uL — ABNORMAL HIGH (ref 4.0–10.5)

## 2014-03-04 LAB — URINALYSIS, ROUTINE W REFLEX MICROSCOPIC
BILIRUBIN URINE: NEGATIVE
GLUCOSE, UA: NEGATIVE mg/dL
HGB URINE DIPSTICK: NEGATIVE
KETONES UR: NEGATIVE mg/dL
LEUKOCYTES UA: NEGATIVE
Nitrite: NEGATIVE
PH: 5.5 (ref 5.0–8.0)
PROTEIN: NEGATIVE mg/dL
Specific Gravity, Urine: 1.02 (ref 1.005–1.030)
Urobilinogen, UA: 0.2 mg/dL (ref 0.0–1.0)

## 2014-03-04 LAB — APTT: aPTT: 36 seconds (ref 24–37)

## 2014-03-04 LAB — PROTIME-INR
INR: 1.13 (ref 0.00–1.49)
PROTHROMBIN TIME: 14.5 s (ref 11.6–15.2)

## 2014-03-04 MED ORDER — SODIUM CHLORIDE 0.9 % IV SOLN
INTRAVENOUS | Status: DC
  Administered 2014-03-04 – 2014-03-05 (×2): via INTRAVENOUS

## 2014-03-04 MED ORDER — ONDANSETRON HCL 4 MG PO TABS: 4.0000 mg | ORAL_TABLET | Freq: Four times a day (QID) | ORAL | Status: DC | PRN

## 2014-03-04 MED ORDER — LORATADINE 10 MG PO TABS
10.0000 mg | ORAL_TABLET | Freq: Every day | ORAL | Status: DC
  Administered 2014-03-04 – 2014-03-07 (×4): 10 mg via ORAL
  Filled 2014-03-04 (×4): qty 1

## 2014-03-04 MED ORDER — FLUTICASONE PROPIONATE 50 MCG/ACT NA SUSP
1.0000 | Freq: Every day | NASAL | Status: DC
  Administered 2014-03-04 – 2014-03-07 (×4): 1 via NASAL
  Filled 2014-03-04: qty 16

## 2014-03-04 MED ORDER — ALBUTEROL SULFATE (2.5 MG/3ML) 0.083% IN NEBU
3.0000 mL | INHALATION_SOLUTION | Freq: Two times a day (BID) | RESPIRATORY_TRACT | Status: DC
  Administered 2014-03-04: 3 mL via RESPIRATORY_TRACT
  Filled 2014-03-04: qty 3

## 2014-03-04 MED ORDER — HYDROMORPHONE HCL PF 1 MG/ML IJ SOLN: 0.5000 mg | INTRAMUSCULAR | Status: DC | PRN

## 2014-03-04 MED ORDER — CYCLOBENZAPRINE HCL 10 MG PO TABS
10.0000 mg | ORAL_TABLET | Freq: Three times a day (TID) | ORAL | Status: DC | PRN
  Administered 2014-03-04 – 2014-03-05 (×2): 10 mg via ORAL
  Filled 2014-03-04 (×2): qty 1

## 2014-03-04 MED ORDER — ALBUTEROL SULFATE (2.5 MG/3ML) 0.083% IN NEBU: 3.0000 mL | INHALATION_SOLUTION | RESPIRATORY_TRACT | Status: DC | PRN

## 2014-03-04 MED ORDER — ACETAMINOPHEN 650 MG RE SUPP: 650.0000 mg | Freq: Four times a day (QID) | RECTAL | Status: DC | PRN

## 2014-03-04 MED ORDER — HYDROMORPHONE HCL PF 1 MG/ML IJ SOLN
1.0000 mg | INTRAMUSCULAR | Status: DC | PRN
  Administered 2014-03-04 – 2014-03-06 (×11): 1 mg via INTRAVENOUS
  Filled 2014-03-04 (×11): qty 1

## 2014-03-04 MED ORDER — ALBUTEROL SULFATE (2.5 MG/3ML) 0.083% IN NEBU
3.0000 mL | INHALATION_SOLUTION | Freq: Four times a day (QID) | RESPIRATORY_TRACT | Status: DC
  Administered 2014-03-05 – 2014-03-07 (×9): 3 mL via RESPIRATORY_TRACT
  Filled 2014-03-04 (×9): qty 3

## 2014-03-04 MED ORDER — ALPRAZOLAM 1 MG PO TABS
1.0000 mg | ORAL_TABLET | Freq: Two times a day (BID) | ORAL | Status: DC
  Administered 2014-03-04 – 2014-03-07 (×7): 1 mg via ORAL
  Filled 2014-03-04 (×7): qty 1

## 2014-03-04 MED ORDER — NIACIN 500 MG PO TABS
500.0000 mg | ORAL_TABLET | Freq: Every day | ORAL | Status: DC
  Administered 2014-03-04 – 2014-03-07 (×4): 500 mg via ORAL
  Filled 2014-03-04 (×5): qty 1

## 2014-03-04 MED ORDER — MORPHINE SULFATE 4 MG/ML IJ SOLN
4.0000 mg | Freq: Once | INTRAMUSCULAR | Status: AC
  Administered 2014-03-04: 4 mg via INTRAVENOUS
  Filled 2014-03-04: qty 1

## 2014-03-04 MED ORDER — OXYCODONE HCL 5 MG PO TABS
5.0000 mg | ORAL_TABLET | ORAL | Status: DC | PRN
  Administered 2014-03-04 – 2014-03-05 (×4): 5 mg via ORAL
  Filled 2014-03-04 (×4): qty 1

## 2014-03-04 MED ORDER — SIMVASTATIN 20 MG PO TABS
40.0000 mg | ORAL_TABLET | Freq: Every day | ORAL | Status: DC
  Administered 2014-03-04 – 2014-03-06 (×3): 40 mg via ORAL
  Filled 2014-03-04 (×3): qty 2

## 2014-03-04 MED ORDER — LISINOPRIL 10 MG PO TABS
20.0000 mg | ORAL_TABLET | Freq: Every morning | ORAL | Status: DC
  Administered 2014-03-04 – 2014-03-05 (×2): 20 mg via ORAL
  Filled 2014-03-04 (×2): qty 2

## 2014-03-04 MED ORDER — DOCUSATE SODIUM 100 MG PO CAPS
100.0000 mg | ORAL_CAPSULE | Freq: Every day | ORAL | Status: DC
  Administered 2014-03-04 – 2014-03-06 (×3): 100 mg via ORAL
  Filled 2014-03-04 (×3): qty 1

## 2014-03-04 MED ORDER — OLOPATADINE HCL 0.1 % OP SOLN
1.0000 [drp] | Freq: Two times a day (BID) | OPHTHALMIC | Status: DC
  Administered 2014-03-04 – 2014-03-07 (×7): 1 [drp] via OPHTHALMIC
  Filled 2014-03-04: qty 5

## 2014-03-04 MED ORDER — ONDANSETRON HCL 4 MG/2ML IJ SOLN: 4.0000 mg | Freq: Four times a day (QID) | INTRAMUSCULAR | Status: DC | PRN

## 2014-03-04 MED ORDER — ALUM & MAG HYDROXIDE-SIMETH 200-200-20 MG/5ML PO SUSP: 30.0000 mL | Freq: Four times a day (QID) | ORAL | Status: DC | PRN

## 2014-03-04 MED ORDER — HYDROCODONE-ACETAMINOPHEN 10-325 MG PO TABS
0.5000 | ORAL_TABLET | Freq: Four times a day (QID) | ORAL | Status: DC
  Administered 2014-03-04 – 2014-03-07 (×13): 0.5 via ORAL
  Filled 2014-03-04 (×13): qty 1

## 2014-03-04 MED ORDER — ACETAMINOPHEN 325 MG PO TABS: 650.0000 mg | ORAL_TABLET | Freq: Four times a day (QID) | ORAL | Status: DC | PRN

## 2014-03-04 NOTE — Consult Note (Signed)
Reason for Consult: Rectus sheath hematoma Referring Physician: Hospitalist  Brandon Kaiser is an 53 y.o. male.  HPI: Patient is a 53 year old obese white male who had a severe coughing episode and developed significant abdominal pain along the left side of his abdomen. He presented emergency room for further evaluation treatment. CT scan of the abdomen and pelvis revealed a large hematoma within the left rectus sheath. He has been on Plavix and aspirin. He was admitted for pain control and monitoring of his hemoglobin. His hemoglobins have remained stable over the past 24 hours. He is still having pain, but it seems to be somewhat controlled with pain medication. He is having some spasms of the abdominal wall and back. His back spasms actually been occurring over the past few weeks.  Past Medical History  Diagnosis Date  . Dyslipidemia   . CAD (coronary artery disease)   . Tobacco use disorder   . Anxiety state, unspecified   . Esophageal reflux   . Allergic rhinitis, cause unspecified   . Hx of CABG     2002, Dr. Roxan Hockey, [place]  . RBBB (right bundle branch block)     Noted October, 2012  . Pneumonia   . HTN (hypertension)   . Bulging lumbar disc 03/04/2014  . Hepatic steatosis 03/04/2014    Past Surgical History  Procedure Laterality Date  . Coronary artery bypass graft  2002    5 VESSEL   . Cardiac catheterization      Family History  Problem Relation Age of Onset  . Heart attack Father 70  . Coronary artery disease      Brother and Sisters hx of CAD  . Cancer Mother     Breast Cancer    Social History:  reports that he has been smoking Cigarettes.  He has a 16 pack-year smoking history. He has never used smokeless tobacco. He reports that he does not drink alcohol or use illicit drugs.  Allergies:  Allergies  Allergen Reactions  . Erythromycin Shortness Of Breath  . Cephalexin Rash    Medications: I have reviewed the patient's current medications.  Results for  orders placed during the hospital encounter of 03/03/14 (from the past 48 hour(s))  CBC WITH DIFFERENTIAL     Status: Abnormal   Collection Time    03/03/14 10:45 PM      Result Value Ref Range   WBC 11.5 (*) 4.0 - 10.5 K/uL   RBC 4.91  4.22 - 5.81 MIL/uL   Hemoglobin 15.7  13.0 - 17.0 g/dL   HCT 46.8  39.0 - 52.0 %   MCV 95.3  78.0 - 100.0 fL   MCH 32.0  26.0 - 34.0 pg   MCHC 33.5  30.0 - 36.0 g/dL   RDW 13.7  11.5 - 15.5 %   Platelets 195  150 - 400 K/uL   Neutrophils Relative % 84 (*) 43 - 77 %   Neutro Abs 9.6 (*) 1.7 - 7.7 K/uL   Lymphocytes Relative 12  12 - 46 %   Lymphs Abs 1.4  0.7 - 4.0 K/uL   Monocytes Relative 4  3 - 12 %   Monocytes Absolute 0.5  0.1 - 1.0 K/uL   Eosinophils Relative 0  0 - 5 %   Eosinophils Absolute 0.0  0.0 - 0.7 K/uL   Basophils Relative 0  0 - 1 %   Basophils Absolute 0.0  0.0 - 0.1 K/uL  COMPREHENSIVE METABOLIC PANEL     Status: Abnormal  Collection Time    03/03/14 10:45 PM      Result Value Ref Range   Sodium 137  137 - 147 mEq/L   Potassium 5.3  3.7 - 5.3 mEq/L   Chloride 100  96 - 112 mEq/L   CO2 26  19 - 32 mEq/L   Glucose, Bld 159 (*) 70 - 99 mg/dL   BUN 17  6 - 23 mg/dL   Creatinine, Ser 0.88  0.50 - 1.35 mg/dL   Calcium 9.2  8.4 - 10.5 mg/dL   Total Protein 6.8  6.0 - 8.3 g/dL   Albumin 3.5  3.5 - 5.2 g/dL   AST 32  0 - 37 U/L   ALT 43  0 - 53 U/L   Alkaline Phosphatase 46  39 - 117 U/L   Total Bilirubin 0.5  0.3 - 1.2 mg/dL   GFR calc non Af Amer >90  >90 mL/min   GFR calc Af Amer >90  >90 mL/min   Comment: (NOTE)     The eGFR has been calculated using the CKD EPI equation.     This calculation has not been validated in all clinical situations.     eGFR's persistently <90 mL/min signify possible Chronic Kidney     Disease.  TROPONIN I     Status: None   Collection Time    03/03/14 10:45 PM      Result Value Ref Range   Troponin I <0.30  <0.30 ng/mL   Comment:            Due to the release kinetics of cTnI,     a  negative result within the first hours     of the onset of symptoms does not rule out     myocardial infarction with certainty.     If myocardial infarction is still suspected,     repeat the test at appropriate intervals.  LIPASE, BLOOD     Status: None   Collection Time    03/03/14 10:45 PM      Result Value Ref Range   Lipase 31  11 - 59 U/L  APTT     Status: None   Collection Time    03/03/14 11:47 PM      Result Value Ref Range   aPTT 36  24 - 37 seconds  PROTIME-INR     Status: None   Collection Time    03/03/14 11:47 PM      Result Value Ref Range   Prothrombin Time 14.5  11.6 - 15.2 seconds   INR 1.13  0.00 - 1.49  TYPE AND SCREEN     Status: None   Collection Time    03/04/14 12:15 AM      Result Value Ref Range   ABO/RH(D) A POS     Antibody Screen NEG     Sample Expiration 03/07/2014    HEMOGLOBIN AND HEMATOCRIT, BLOOD     Status: None   Collection Time    03/04/14 12:47 AM      Result Value Ref Range   Hemoglobin 14.6  13.0 - 17.0 g/dL   HCT 43.4  39.0 - 67.5 %  BASIC METABOLIC PANEL     Status: Abnormal   Collection Time    03/04/14  5:30 AM      Result Value Ref Range   Sodium 136 (*) 137 - 147 mEq/L   Potassium 5.1  3.7 - 5.3 mEq/L   Chloride 98  96 - 112  mEq/L   CO2 29  19 - 32 mEq/L   Glucose, Bld 107 (*) 70 - 99 mg/dL   BUN 15  6 - 23 mg/dL   Creatinine, Ser 0.76  0.50 - 1.35 mg/dL   Calcium 8.7  8.4 - 10.5 mg/dL   GFR calc non Af Amer >90  >90 mL/min   GFR calc Af Amer >90  >90 mL/min   Comment: (NOTE)     The eGFR has been calculated using the CKD EPI equation.     This calculation has not been validated in all clinical situations.     eGFR's persistently <90 mL/min signify possible Chronic Kidney     Disease.  CBC     Status: Abnormal   Collection Time    03/04/14  5:30 AM      Result Value Ref Range   WBC 13.3 (*) 4.0 - 10.5 K/uL   RBC 4.46  4.22 - 5.81 MIL/uL   Hemoglobin 14.2  13.0 - 17.0 g/dL   HCT 42.8  39.0 - 52.0 %   MCV 96.0   78.0 - 100.0 fL   MCH 31.8  26.0 - 34.0 pg   MCHC 33.2  30.0 - 36.0 g/dL   RDW 13.8  11.5 - 15.5 %   Platelets 203  150 - 400 K/uL  URINALYSIS, ROUTINE W REFLEX MICROSCOPIC     Status: None   Collection Time    03/04/14  6:26 AM      Result Value Ref Range   Color, Urine YELLOW  YELLOW   APPearance CLEAR  CLEAR   Specific Gravity, Urine 1.020  1.005 - 1.030   pH 5.5  5.0 - 8.0   Glucose, UA NEGATIVE  NEGATIVE mg/dL   Hgb urine dipstick NEGATIVE  NEGATIVE   Bilirubin Urine NEGATIVE  NEGATIVE   Ketones, ur NEGATIVE  NEGATIVE mg/dL   Protein, ur NEGATIVE  NEGATIVE mg/dL   Urobilinogen, UA 0.2  0.0 - 1.0 mg/dL   Nitrite NEGATIVE  NEGATIVE   Leukocytes, UA NEGATIVE  NEGATIVE   Comment: MICROSCOPIC NOT DONE ON URINES WITH NEGATIVE PROTEIN, BLOOD, LEUKOCYTES, NITRITE, OR GLUCOSE <1000 mg/dL.  HEMOGLOBIN AND HEMATOCRIT, BLOOD     Status: None   Collection Time    03/04/14  1:04 PM      Result Value Ref Range   Hemoglobin 13.4  13.0 - 17.0 g/dL   HCT 40.5  39.0 - 52.0 %    Ct Abdomen Pelvis W Contrast  03/04/2014   CLINICAL DATA:  Left side pain with swelling. Recent coughing. Left abdominal pain.  EXAM: CT ABDOMEN AND PELVIS WITH CONTRAST  TECHNIQUE: Multidetector CT imaging of the abdomen and pelvis was performed using the standard protocol following bolus administration of intravenous contrast.  CONTRAST:  40m OMNIPAQUE IOHEXOL 300 MG/ML SOLN, 1010mOMNIPAQUE IOHEXOL 300 MG/ML SOLN  COMPARISON:  Report from 09/03/2004  FINDINGS: Airway thickening in both lower lobes, right greater than left.  Diffuse hepatic steatosis. The spleen, pancreas, and adrenal glands appear normal.  Densities in the collecting systems bilaterally favor early contrast excretion over stones. This early contrast excretion could obscure small nonobstructive calculi.  Aortoiliac atherosclerotic vascular disease. No dilated bowel. Appendix poorly seen. Small umbilical hernia contains adipose tissue. No free pelvic fluid.  Urinary bladder unremarkable.  Disc bulges are present at L3-4 and L4-5. There may be bilateral foraminal stenosis at the L4-5 level. Degenerative facet arthropathy noted at the L4-5 level, right greater than left.  Left  rectus sheath hematoma, 6.9 x 13.7 by 22.0 cm. There is also swelling of the left lateral abdominal wall musculature, which is thicker than the right suspicious for some hematoma tracking along the lateral wall musculature is well. I do not see a left lower rib fracture.  IMPRESSION: 1. Large left rectus sheath hematoma, 22 cm x 13.7 cm x 6.9 cm. There is also likely some hematoma in the left lateral abdominal wall musculature, although much less than in the rectus sheath. 2. Lumbar disc bulges, potentially causing foraminal impingement at L4-5. 3. Atherosclerosis. 4. Airway thickening in the lower lobes, right greater than left, query bronchitis. 5. Diffuse hepatic steatosis. 6. Low sensitivity for nonobstructive renal calculi due to early contrast excretion in the collecting systems.   Electronically Signed   By: Sherryl Barters M.D.   On: 03/04/2014 00:19    ROS: See chart Blood pressure 98/51, pulse 88, temperature 98.1 F (36.7 C), temperature source Oral, resp. rate 18, height _0  (1.905 m), weight 137.939 kg (304 lb 1.6 oz), SpO2 92.00%. Physical Exam: Pleasant white male who is lying comfortably in the bed in no acute distress. Abdomen soft with tenderness noted along the left rectus sheath with obvious swelling present. No visible ecchymosis is noted at this time. No rigidity is noted.  Assessment/Plan: Impression: Abdominal wall hematoma complicated by Plavix and aspirin use, stable hemoglobins Plan: No need for acute surgical intervention at this time. He should stay off his Plavix and aspirin for at least 2 weeks. He has been told that this will take some time to resolve. I was just pain medication and Flexeril and trouble his symptoms. We'll see him in my office in 2 weeks  for followup. He is okay for discharge from a surgical standpoint.  JENKINS,MARK A 03/04/2014, 4:47 PM

## 2014-03-04 NOTE — Progress Notes (Signed)
Utilization review completed.  

## 2014-03-04 NOTE — Progress Notes (Signed)
Patient complaining of pain d/t cramps in each side of his back.  Rates 7/10.  PRN oxy IR given as ordered and heat packs applied with some relief.  Patient states these cramps "have been happening for about 3 weeks, and they come on suddenly.".  Patient states he has spoke to PCP about this, but still with no explanation.  Will continue to monitor.

## 2014-03-04 NOTE — Progress Notes (Signed)
The patient is a 53 year old man with a history of CAD, status post CABG, who was admitted this morning by Dr. Lovell SheehanJenkins for abdominal pain and radiographic findings of a large rectus sheath hematoma. The CT also revealed a possible left lateral abdominal wall musculature hematoma; lumbar disc bulges and potential foraminal impingement at L4-L5 and airway thickening in the lower lobes of the lung and diffuse hepatic steatosis.  He was briefly seen and examined. His chart, laboratory studies, and vital signs were reviewed. Agree with current management. We'll continue to hold antiplatelet therapy and to follow his hemoglobin/hematocrit every 8 hours. We'll continue pain medication as needed. The patient was advised to stop smoking. He declined a nicotine patch. We'll restart bronchodilator therapy which he uses twice a day.  General surgery consulted and is pending.

## 2014-03-04 NOTE — H&P (Signed)
[facility] History and Physical  Brandon MausRandy Kawabata VHQ:[id number] DOB: 17-Mar-1961 DOA: 03/03/2014  Referring physician:  EDP PCP: Ernestine ConradBLUTH, KIRK, MD  Specialists:   Chief Complaint: Left Upper ABD Pain  HPI: Brandon Kaiser is a 53 y.o. male with a history of CAD S/P CABG x5, Hyperlipidemia who presents to the ED with complaints of sudden severe Left upper ABD Pain following a forceful sneeze this evening.   He presented tot he ED and a CT scan of the ABD was performed which revealed a Left-sided Rectus Sheath Hematoma with dimensions of 22 cm x 14 cm x 7 cm.  The EDP contacted general surgery on call to discuss , Dr. Franky MachoMark Jenkins, who recommended observation for extension of the Heamtoma, and to observe for a continued bleeding and decrease in his hemoglobin as well as for pain control.     Review of Systems:  Constitutional: No Weight Loss, No Weight Gain, Night Sweats, Fevers, Chills, Fatigue, or Generalized Weakness HEENT: No Headaches, Difficulty Swallowing,Tooth/Dental Problems,Sore Throat,  No Sneezing, Rhinitis, Ear Ache, Nasal Congestion, or Post Nasal Drip,  Cardio-vascular:  No Chest pain, Orthopnea, PND, Edema in lower extremities, Anasarca, Dizziness, Palpitations  Resp: No Dyspnea, No DOE, No Productive Cough, No Non-Productive Cough, No Hemoptysis, No Change in Color of Mucus,  No Wheezing.    GI: No Heartburn, Indigestion, +Abdominal Pain, Nausea, Vomiting, Diarrhea, Change in Bowel Habits,  Loss of Appetite  GU: No Dysuria, Change in Color of Urine, No Urgency or Frequency.  No flank pain.  Musculoskeletal: No Joint Pain or Swelling.  No Decreased Range of Motion. No Back Pain.  Neurologic: No Syncope, No Seizures, Muscle Weakness, Paresthesia, Vision Disturbance or Loss, No Diplopia, No Vertigo, No Difficulty Walking,  Skin: No Rash or Lesions. Psych: No Change in Mood or Affect. No Depression or Anxiety. No Memory loss. No Confusion or Hallucinations   Past Medical History   Diagnosis Date  . Dyslipidemia   . CAD (coronary artery disease)   . Tobacco use disorder   . Anxiety state, unspecified   . Esophageal reflux   . Allergic rhinitis, cause unspecified   . Hx of CABG     2002, Dr. Dorris FetchHendrickson, X5  . RBBB (right bundle branch block)     Noted October, 2012  . Pneumonia     Past Surgical History  Procedure Laterality Date  . Coronary artery bypass graft  2002    5 VESSEL   . Cardiac catheterization       Prior to Admission medications   Medication Sig Start Date End Date Taking? Authorizing Provider  albuterol (PROVENTIL HFA;VENTOLIN HFA) 108 (90 BASE) MCG/ACT inhaler Inhale 2 puffs into the lungs every 4 (four) hours as needed for wheezing. 03/13/12 03/03/14 Yes Ward GivensIva L Knapp, MD  ALPRAZolam Prudy Feeler(XANAX) 1 MG tablet Take 1 mg by mouth 2 (two) times daily.     Yes Historical Provider, MD  aspirin EC 325 MG tablet Take 325 mg by mouth every morning.   Yes Historical Provider, MD  cetirizine (ZYRTEC) 10 MG tablet Take 10 mg by mouth every morning.    Yes Historical Provider, MD  clopidogrel (PLAVIX) 75 MG tablet Take 75 mg by mouth at bedtime.    Yes Historical Provider, MD  docusate sodium (COLACE) 100 MG capsule Take 100 mg by mouth at bedtime.    Yes Historical Provider, MD  fluticasone (FLONASE) 50 MCG/ACT nasal spray Place 1 spray into the nose daily as needed for allergies.  Yes Historical Provider, MD  HYDROcodone-acetaminophen (NORCO) 10-325 MG per tablet Take 0.5 tablets by mouth 4 (four) times daily.   Yes Historical Provider, MD  lisinopril (PRINIVIL,ZESTRIL) 20 MG tablet Take 20 mg by mouth every morning.   Yes Historical Provider, MD  niacin 500 MG tablet Take 500 mg by mouth daily with breakfast.     Yes Historical Provider, MD  Olopatadine HCl (PATADAY) 0.2 % SOLN Place 1 drop into both eyes daily.   Yes Historical Provider, MD  simvastatin (ZOCOR) 40 MG tablet Take 40 mg by mouth at bedtime.     Yes Historical Provider, MD      Allergies   Allergen Reactions  . Erythromycin Shortness Of Breath  . Cephalexin Rash     Social History:  reports that he has been smoking Cigarettes.  He has a 16 pack-year smoking history. He has never used smokeless tobacco. He reports that he does not drink alcohol or use illicit drugs.     Family History  Problem Relation Age of Onset  . Heart attack Father 25  . Coronary artery disease      Brother and Sisters hx of CAD  . Cancer Mother     Breast Cancer       Physical Exam:  GEN:  Pleasant  53 y.o. male  examined  and in no acute distress; cooperative with exam Filed Vitals:   03/03/14 2345 03/04/14 0000 03/04/14 0015 03/04/14 0030  BP: 118/61 138/76 117/63 129/90  Pulse: 88 91 93 89  Temp:      TempSrc:      Resp: 18 0 18 18  Height:      Weight:      SpO2: 91% 92% 92% 91%   Blood pressure 129/90, pulse 89, temperature 97.9 F (36.6 C), temperature source Oral, resp. rate 18, height 6\' 3"  (1.905 m), weight 138.347 kg (305 lb), SpO2 91.00%. PSYCH: SHe is alert and oriented x4; does not appear anxious does not appear depressed; affect is normal HEENT: Normocephalic and Atraumatic, Mucous membranes pink; PERRLA; EOM intact; Fundi:  Benign;  No scleral icterus, Nares: Patent, Oropharynx: Clear, Edentulous or Fair Dentition, Neck:  FROM, no cervical lymphadenopathy nor thyromegaly or carotid bruit; no JVD; Breasts:: Not examined CHEST WALL: No tenderness CHEST: Normal respiration, clear to auscultation bilaterally HEART: Regular rate and rhythm; no murmurs rubs or gallops BACK: No kyphosis or scoliosis; no CVA tenderness ABDOMEN: Positive Bowel Sounds, Scaphoid, Obese, soft non-tender; no masses, no organomegaly, no pannus; no intertriginous candida. Rectal Exam: Not done EXTREMITIES: No bone or joint deformity; age-appropriate arthropathy of the hands and knees; no cyanosis, clubbing or edema; no ulcerations. Genitalia: not examined PULSES: 2+ and symmetric SKIN: Normal  hydration no rash or ulceration CNS:   Mental Status:  Alert, oriented, thought content appropriate. Speech fluent without evidence of aphasia. Able to follow 3 step commands without difficulty. In No obvious pain.  Cranial Nerves:  II: Discs flat bilaterally; Visual fields Intact,  or Decreased peripheral vision to the left or right. Pupils equal and reactive.  III,IV, VI: right ptosis, extra-ocular motions intact bilaterally  V,VII: smile symmetric, facial light touch sensation normal bilaterally  VIII: hearing decreasesd bilaterally  IX,X: gag reflex present  XI: bilateral shoulder shrug  XII: midline tongue extension  Motor:  Right : Upper extremity 5/5 Left: Upper extremity 5/5  Lower extremity 5/5 Lower extremity 5/5  Tone and bulk:normal tone throughout; no atrophy noted  Sensory: Pinprick and light touch intact  throughout, bilaterally  Deep Tendon Reflexes: 2+ and symmetric throughout  Plantars/ Babinski: Right: equivocal or upgoing or normal Left: equivocal or upgoing or normal   Cerebellar:  Finger to nose with or without difficulty.  Gait: deferred  Vascular: pulses palpable throughout    Labs on Admission:  Basic Metabolic Panel:  Recent Labs Lab 03/03/14 2245  NA 137  K 5.3  CL 100  CO2 26  GLUCOSE 159*  BUN 17  CREATININE 0.88  CALCIUM 9.2   Liver Function Tests:  Recent Labs Lab 03/03/14 2245  AST 32  ALT 43  ALKPHOS 46  BILITOT 0.5  PROT 6.8  ALBUMIN 3.5    Recent Labs Lab 03/03/14 2245  LIPASE 31   No results found for this basename: AMMONIA,  in the last 168 hours CBC:  Recent Labs Lab 03/03/14 2245 03/04/14 0047  WBC 11.5*  --   NEUTROABS 9.6*  --   HGB 15.7 14.6  HCT 46.8 43.4  MCV 95.3  --   PLT 195  --    Cardiac Enzymes:  Recent Labs Lab 03/03/14 2245  TROPONINI <0.30    BNP (last 3 results) No results found for this basename: PROBNP,  in the last 8760 hours CBG: No results found for this basename: GLUCAP,  in  the last 168 hours  Radiological Exams on Admission: Ct Abdomen Pelvis W Contrast  03/04/2014   CLINICAL DATA:  Left side pain with swelling. Recent coughing. Left abdominal pain.  EXAM: CT ABDOMEN AND PELVIS WITH CONTRAST  TECHNIQUE: Multidetector CT imaging of the abdomen and pelvis was performed using the standard protocol following bolus administration of intravenous contrast.  CONTRAST:  50mL OMNIPAQUE IOHEXOL 300 MG/ML SOLN, OMNIPAQUE IOHEXOL 300 MG/ML SOLN  COMPARISON:  Report from 09/03/2004  FINDINGS: Airway thickening in both lower lobes, right greater than left.  Diffuse hepatic steatosis. The spleen, pancreas, and adrenal glands appear normal.  Densities in the collecting systems bilaterally favor early contrast excretion over stones. This early contrast excretion could obscure small nonobstructive calculi.  Aortoiliac atherosclerotic vascular disease. No dilated bowel. Appendix poorly seen. Small umbilical hernia contains adipose tissue. No free pelvic fluid. Urinary bladder unremarkable.  Disc bulges are present at L3-4 and L4-5. There may be bilateral foraminal stenosis at the L4-5 level. Degenerative facet arthropathy noted at the L4-5 level, right greater than left.  Left rectus sheath hematoma, 6.9 x 13.7 by 22.0 cm. There is also swelling of the left lateral abdominal wall musculature, which is thicker than the right suspicious for some hematoma tracking along the lateral wall musculature is well. I do not see a left lower rib fracture.  IMPRESSION: 1. Large left rectus sheath hematoma, 22 cm x 13.7 cm x 6.9 cm. There is also likely some hematoma in the left lateral abdominal wall musculature, although much less than in the rectus sheath. 2. Lumbar disc bulges, potentially causing foraminal impingement at L4-5. 3. Atherosclerosis. 4. Airway thickening in the lower lobes, right greater than left, query bronchitis. 5. Diffuse hepatic steatosis. 6. Low sensitivity for nonobstructive renal  calculi due to early contrast excretion in the collecting systems.   Electronically Signed   By: Herbie Baltimore M.D.   On: 03/04/2014 00:19      Assessment/Plan:   53 y.o. male with  Principal Problem:   Rectus sheath hematoma Active Problems:   CAD (coronary artery disease)   Dyslipidemia   Tobacco use disorder    1.  Rectus Sheath Hematoma-  Spontaneous rupture due to Sneezing( Valsalva).  Pain control, and Monitor for hemoglobin Decrease and monitor for increase in size of hematoma.   Holding Aspirin and Plavix for now.     2.  CAD-  Stable,  On Plavix and ASA at home, but on hold due to #1.    3.  Dyslipidemia-   Continue Statin Rx.    4.  Tobacco Use Disorder- Pre-Contemplative,  Counseled,  Nicotine patch daily.    5.  SCDs for DVT Prophylaxis.        Code Status:   FULL CODE Family Communication:    No Family Present Disposition Plan:       Observation  Time spent:  3460 Minutes  Ron ParkerJENKINS,HARVETTE C [facility] Pager [phone]  If 7PM-7AM, please contact night-coverage [email or web address] Password [email or web address] 03/04/2014, 1:22 AM

## 2014-03-05 ENCOUNTER — Observation Stay (HOSPITAL_COMMUNITY): Payer: Medicaid Other

## 2014-03-05 DIAGNOSIS — I10 Essential (primary) hypertension: Secondary | ICD-10-CM

## 2014-03-05 LAB — BASIC METABOLIC PANEL
BUN: 23 mg/dL (ref 6–23)
CALCIUM: 8.8 mg/dL (ref 8.4–10.5)
CO2: 27 mEq/L (ref 19–32)
CREATININE: 0.96 mg/dL (ref 0.50–1.35)
Chloride: 99 mEq/L (ref 96–112)
GFR calc Af Amer: >90 mL/min (ref >90)
GFR calc non Af Amer: >90 mL/min (ref >90)
Glucose, Bld: 110 mg/dL — ABNORMAL HIGH (ref 70–99)
Potassium: 5.2 mEq/L (ref 3.7–5.3)
Sodium: 136 mEq/L — ABNORMAL LOW (ref 137–147)

## 2014-03-05 LAB — CBC
HCT: 39.8 % (ref 39.0–52.0)
Hemoglobin: 12.9 g/dL — ABNORMAL LOW (ref 13.0–17.0)
MCH: 31.4 pg (ref 26.0–34.0)
MCHC: 32.4 g/dL (ref 30.0–36.0)
MCV: 96.8 fL (ref 78.0–100.0)
Platelets: 176 10*3/uL (ref 150–400)
RBC: 4.11 MIL/uL — ABNORMAL LOW (ref 4.22–5.81)
RDW: 13.9 % (ref 11.5–15.5)
WBC: 16.2 10*3/uL — ABNORMAL HIGH (ref 4.0–10.5)

## 2014-03-05 LAB — PRO B NATRIURETIC PEPTIDE: Pro B Natriuretic peptide (BNP): 14.7 pg/mL (ref 0–125)

## 2014-03-05 MED ORDER — LEVOFLOXACIN 750 MG PO TABS
750.0000 mg | ORAL_TABLET | Freq: Every day | ORAL | Status: DC
  Administered 2014-03-05 – 2014-03-07 (×3): 750 mg via ORAL
  Filled 2014-03-05 (×3): qty 1

## 2014-03-05 MED ORDER — ALBUTEROL SULFATE (2.5 MG/3ML) 0.083% IN NEBU: 2.5000 mg | INHALATION_SOLUTION | RESPIRATORY_TRACT | Status: DC | PRN

## 2014-03-05 MED ORDER — FUROSEMIDE 10 MG/ML IJ SOLN
40.0000 mg | Freq: Once | INTRAMUSCULAR | Status: AC
  Administered 2014-03-05: 40 mg via INTRAVENOUS
  Filled 2014-03-05: qty 4

## 2014-03-05 MED ORDER — PREDNISONE 20 MG PO TABS
40.0000 mg | ORAL_TABLET | Freq: Every day | ORAL | Status: DC
  Administered 2014-03-06: 40 mg via ORAL
  Filled 2014-03-05: qty 2

## 2014-03-05 NOTE — Progress Notes (Addendum)
PATIENT DETAILS Name: Brandon MausRandy Kaiser Age: 53 y.o. Sex: male Date of Birth: 1961/02/14 Admit Date: 03/03/2014 Admitting Physician Ron ParkerHarvette C Jenkins, MD QIH:[id number]:BLUTH, Lyda PeroneKIRK, MD  Subjective: Still complaining of abdominal pain. Short of breath this morning.  Assessment/Plan: Principal Problem:   Rectus sheath hematoma - Supportive care, with narcotics and Flexeril. Aspirin/Plavix being held-history of CAD status post CABG in 2002 but no history of recent PCI. Not sure why he is on both aspirin and Plavix at this time, denies history of CVA/TIA. - Appreciate Surgery input. Hemoglobin continues to be stable.  Active Problems: Acute hypoxic respiratory failure - Suspect secondary to COPD exacerbation. Not sure whether mild diastolic CHF contributing. - Check BNP, and give one dose of IV Lasix. Start on prednisone, scheduled nebs, incentive spirometry and empiric Levaquin.  COPD with exacerbation - As above. - Wean off oxygen as tolerated, continue with nebs, prednisone and Levaquin.  Fever - Febrile last night, increasing leukocytosis- although could be explained by hematoma-his wheezing, suspect pulmonary issue contributing. - Covering empirically with Levaquin-see above.    Dyslipidemia - Continue statin and niacin  Hypertension - Controlled with lisinopril  History of CAD status post CABG in 2002 - Aspirin/Plavix on hold-see above. Continue statins. - Currently with no chest pain   Anxiety disorder - Continue Xanax    Tobacco use disorder - Counseled extensively  Disposition: Remain inpatient- [facility] next 1-2 days  DVT Prophylaxis:  SCD's  Code Status: Full code  Family Communication Spouse at bedside  Procedures:  None  CONSULTS:  general surgery  Time spent 40 minutes-which includes 50% of the time with face-to-face with patient/ family and coordinating care related to the above assessment and plan.    MEDICATIONS: Scheduled Meds: . albuterol   3 mL Inhalation QID  . ALPRAZolam  1 mg Oral BID  . docusate sodium  100 mg Oral QHS  . fluticasone  1 spray Each Nare Daily  . HYDROcodone-acetaminophen  0.5 tablet Oral QID  . levofloxacin  750 mg Oral Daily  . lisinopril  20 mg Oral q morning - 10a  . loratadine  10 mg Oral Daily  . niacin  500 mg Oral Q breakfast  . olopatadine  1 drop Both Eyes BID  . [START ON 03/06/2014] predniSONE  40 mg Oral Q breakfast  . simvastatin  40 mg Oral QHS   Continuous Infusions:  PRN Meds:.acetaminophen, acetaminophen, albuterol, alum & mag hydroxide-simeth, cyclobenzaprine, HYDROmorphone (DILAUDID) injection, ondansetron (ZOFRAN) IV, ondansetron, oxyCODONE  Antibiotics: Anti-infectives   Start     Dose/Rate Route Frequency Ordered Stop   03/05/14 1500  levofloxacin (LEVAQUIN) tablet 750 mg     750 mg Oral Daily 03/05/14 1309         PHYSICAL EXAM: Vital signs in last 24 hours: Filed Vitals:   03/05/14 0038 03/05/14 0628 03/05/14 0711 03/05/14 1202  BP: 109/59 115/59    Pulse: 100 80    Temp: 100.2 F (37.9 C) 98.5 F (36.9 C)    TempSrc: Oral Oral    Resp: 20 20    Height:      Weight:      SpO2: 93% 95% 90% 81%    Weight change:  Filed Weights   03/03/14 2137 03/04/14 0619  Weight: 138.347 kg (305 lb) 137.939 kg (304 lb 1.6 oz)   Body mass index is 38.01 kg/(m^2).   Gen Exam: Awake and alert with clear speech.   Neck: Supple, No JVD.  Chest: Good air entry bilaterally, rhonchi all over. CVS: S1 S2 Regular, no murmurs.  Abdomen: soft, BS +, non tender, non distended. Significant ecchymosis on the left flank area. Extremities: no edema, lower extremities warm to touch. Neurologic: Non Focal.   Skin: No Rash.   Wounds: N/A.   Intake/Output from previous day:  Intake/Output Summary (Last 24 hours) at 03/05/14 1353 Last data filed at 03/05/14 1300  Gross per 24 hour  Intake 2421.25 ml  Output    500 ml  Net 1921.25 ml     LAB RESULTS: CBC  Recent Labs Lab  03/03/14 2245 03/04/14 0047 03/04/14 0530 03/04/14 1304 03/04/14 2046 03/05/14 0519  WBC 11.5*  --  13.3*  --   --  16.2*  HGB 15.7 14.6 14.2 13.4 13.6 12.9*  HCT 46.8 43.4 42.8 40.5 41.0 39.8  PLT 195  --  203  --   --  176  MCV 95.3  --  96.0  --   --  96.8  MCH 32.0  --  31.8  --   --  31.4  MCHC 33.5  --  33.2  --   --  32.4  RDW 13.7  --  13.8  --   --  13.9  LYMPHSABS 1.4  --   --   --   --   --   MONOABS 0.5  --   --   --   --   --   EOSABS 0.0  --   --   --   --   --   BASOSABS 0.0  --   --   --   --   --     Chemistries   Recent Labs Lab 03/03/14 2245 03/04/14 0530 03/05/14 0519  NA 137 136* 136*  K 5.3 5.1 5.2  CL 100 98 99  CO2 26 29 27   GLUCOSE 159* 107* 110*  BUN 17 15 23   CREATININE 0.88 0.76 0.96  CALCIUM 9.2 8.7 8.8    CBG: No results found for this basename: GLUCAP,  in the last 168 hours  GFR Estimated Creatinine Clearance: 134.8 ml/min (by C-G formula based on Cr of 0.96).  Coagulation profile  Recent Labs Lab 03/03/14 2347  INR 1.13    Cardiac Enzymes  Recent Labs Lab 03/03/14 2245  TROPONINI <0.30    No components found with this basename: POCBNP,  No results found for this basename: DDIMER,  in the last 72 hours No results found for this basename: HGBA1C,  in the last 72 hours No results found for this basename: CHOL, HDL, LDLCALC, TRIG, CHOLHDL, LDLDIRECT,  in the last 72 hours No results found for this basename: TSH, T4TOTAL, FREET3, T3FREE, THYROIDAB,  in the last 72 hours No results found for this basename: VITAMINB12, FOLATE, FERRITIN, TIBC, IRON, RETICCTPCT,  in the last 72 hours  Recent Labs  03/03/14 2245  LIPASE 31    Urine Studies No results found for this basename: UACOL, UAPR, USPG, UPH, UTP, UGL, UKET, UBIL, UHGB, UNIT, UROB, ULEU, UEPI, UWBC, URBC, UBAC, CAST, CRYS, UCOM, BILUA,  in the last 72 hours  MICROBIOLOGY: No results found for this or any previous visit (from the past 240 hour(s)).  RADIOLOGY  STUDIES/RESULTS: Dg Chest 2 View  03/05/2014   CLINICAL DATA:  ?pna  EXAM: CHEST  2 VIEW  COMPARISON:  Two view chest dated 02/08/2014  FINDINGS: Low lung volumes. Cardiac silhouette is enlarged. Stable post median sternotomy and coronary artery bypass graft  changes. There is prominence of the interstitial markings and peribronchial cuffing. Areas of increased density within the lung bases and perihilar regions. No focal regions consolidation. No acute osseous abnormalities.  IMPRESSION: Interstitial infiltrates likely representing pulmonary edema. An underlying component of scarring versus atelectasis within the lung bases.   Electronically Signed   By: Salome Holmes M.D.   On: 03/05/2014 12:37   Ct Abdomen Pelvis W Contrast  03/04/2014   CLINICAL DATA:  Left side pain with swelling. Recent coughing. Left abdominal pain.  EXAM: CT ABDOMEN AND PELVIS WITH CONTRAST  TECHNIQUE: Multidetector CT imaging of the abdomen and pelvis was performed using the standard protocol following bolus administration of intravenous contrast.  CONTRAST:  50mL OMNIPAQUE IOHEXOL 300 MG/ML SOLN, OMNIPAQUE IOHEXOL 300 MG/ML SOLN  COMPARISON:  Report from 09/03/2004  FINDINGS: Airway thickening in both lower lobes, right greater than left.  Diffuse hepatic steatosis. The spleen, pancreas, and adrenal glands appear normal.  Densities in the collecting systems bilaterally favor early contrast excretion over stones. This early contrast excretion could obscure small nonobstructive calculi.  Aortoiliac atherosclerotic vascular disease. No dilated bowel. Appendix poorly seen. Small umbilical hernia contains adipose tissue. No free pelvic fluid. Urinary bladder unremarkable.  Disc bulges are present at L3-4 and L4-5. There may be bilateral foraminal stenosis at the L4-5 level. Degenerative facet arthropathy noted at the L4-5 level, right greater than left.  Left rectus sheath hematoma, 6.9 x 13.7 by 22.0 cm. There is also swelling of  the left lateral abdominal wall musculature, which is thicker than the right suspicious for some hematoma tracking along the lateral wall musculature is well. I do not see a left lower rib fracture.  IMPRESSION: 1. Large left rectus sheath hematoma, 22 cm x 13.7 cm x 6.9 cm. There is also likely some hematoma in the left lateral abdominal wall musculature, although much less than in the rectus sheath. 2. Lumbar disc bulges, potentially causing foraminal impingement at L4-5. 3. Atherosclerosis. 4. Airway thickening in the lower lobes, right greater than left, query bronchitis. 5. Diffuse hepatic steatosis. 6. Low sensitivity for nonobstructive renal calculi due to early contrast excretion in the collecting systems.   Electronically Signed   By: Herbie Baltimore M.D.   On: 03/04/2014 00:19    Jeoffrey Massed, MD  [facility] Pager:[phone]  If 7PM-7AM, please contact night-coverage [email or web address] Password [email or web address] 03/05/2014, 1:53 PM   LOS: 2 days   **Disclaimer: This note may have been dictated with voice recognition software. Similar sounding words can inadvertently be transcribed and this note may contain transcription errors which may not have been corrected upon publication of note.**

## 2014-03-05 NOTE — Progress Notes (Signed)
Subjective: States his muscle spasms have improved with Flexeril. He still has abdominal pain, but this has eased. He does have a cough.  Objective: Vital signs in last 24 hours: Temp:  [98.1 F (36.7 C)-101.9 F (38.8 C)] 98.5 F (36.9 C) (06/26 0628) Pulse Rate:  [80-100] 80 (06/26 0628) Resp:  [18-20] 20 (06/26 0628) BP: (94-115)/(51-63) 115/59 mmHg (06/26 0628) SpO2:  [86 %-95 %] 90 % (06/26 0711) Last BM Date: 03/03/14  Intake/Output from previous day: 06/25 0701 - 06/26 0700 In: 2181.3 [P.O.:480; I.V.:1701.3] Out: 500 [Urine:500] Intake/Output this shift:    General appearance: alert, cooperative and no distress GI: Soft. Still tender in the left upper portion of the rectus muscle to palpation. No erythema is noted. No expansion of the hematoma is noted, though this is difficult to fully assess due to body habitus.  Lab Results:   Recent Labs  03/04/14 0530  03/04/14 2046 03/05/14 0519  WBC 13.3*  --   --  16.2*  HGB 14.2  < > 13.6 12.9*  HCT 42.8  < > 41.0 39.8  PLT 203  --   --  176  < > = values in this interval not displayed. BMET  Recent Labs  03/04/14 0530 03/05/14 0519  NA 136* 136*  K 5.1 5.2  CL 98 99  CO2 29 27  GLUCOSE 107* 110*  BUN 15 23  CREATININE 0.76 0.96  CALCIUM 8.7 8.8   PT/INR  Recent Labs  03/03/14 2347  LABPROT 14.5  INR 1.13    Studies/Results: Ct Abdomen Pelvis W Contrast  03/04/2014   CLINICAL DATA:  Left side pain with swelling. Recent coughing. Left abdominal pain.  EXAM: CT ABDOMEN AND PELVIS WITH CONTRAST  TECHNIQUE: Multidetector CT imaging of the abdomen and pelvis was performed using the standard protocol following bolus administration of intravenous contrast.  CONTRAST:  50mL OMNIPAQUE IOHEXOL 300 MG/ML SOLN, 100mL OMNIPAQUE IOHEXOL 300 MG/ML SOLN  COMPARISON:  Report from 09/03/2004  FINDINGS: Airway thickening in both lower lobes, right greater than left.  Diffuse hepatic steatosis. The spleen, pancreas, and  adrenal glands appear normal.  Densities in the collecting systems bilaterally favor early contrast excretion over stones. This early contrast excretion could obscure small nonobstructive calculi.  Aortoiliac atherosclerotic vascular disease. No dilated bowel. Appendix poorly seen. Small umbilical hernia contains adipose tissue. No free pelvic fluid. Urinary bladder unremarkable.  Disc bulges are present at L3-4 and L4-5. There may be bilateral foraminal stenosis at the L4-5 level. Degenerative facet arthropathy noted at the L4-5 level, right greater than left.  Left rectus sheath hematoma, 6.9 x 13.7 by 22.0 cm. There is also swelling of the left lateral abdominal wall musculature, which is thicker than the right suspicious for some hematoma tracking along the lateral wall musculature is well. I do not see a left lower rib fracture.  IMPRESSION: 1. Large left rectus sheath hematoma, 22 cm x 13.7 cm x 6.9 cm. There is also likely some hematoma in the left lateral abdominal wall musculature, although much less than in the rectus sheath. 2. Lumbar disc bulges, potentially causing foraminal impingement at L4-5. 3. Atherosclerosis. 4. Airway thickening in the lower lobes, right greater than left, query bronchitis. 5. Diffuse hepatic steatosis. 6. Low sensitivity for nonobstructive renal calculi due to early contrast excretion in the collecting systems.   Electronically Signed   By: Herbie BaltimoreWalt  Liebkemann M.D.   On: 03/04/2014 00:19    Anti-infectives: Anti-infectives   None  Assessment/Plan: Impression: Rectus hematoma Fever of unknown etiology. I do not find any evidence of infection of the hematoma. This may be either leukomoid reaction or a pulmonary process. No need for surgical intervention at this time.  LOS: 2 days    Daulton Harbaugh A 03/05/2014

## 2014-03-05 NOTE — Progress Notes (Signed)
Spiked a fever of 101.9. Never notified by CNA.  Next recheck was 100.2 without any intervention.

## 2014-03-05 NOTE — Progress Notes (Signed)
Utilization review completed.  

## 2014-03-06 DIAGNOSIS — N179 Acute kidney failure, unspecified: Secondary | ICD-10-CM | POA: Diagnosis present

## 2014-03-06 LAB — CREATININE, URINE, RANDOM: CREATININE, URINE: 94.39 mg/dL

## 2014-03-06 LAB — URINALYSIS, ROUTINE W REFLEX MICROSCOPIC
Bilirubin Urine: NEGATIVE
GLUCOSE, UA: NEGATIVE mg/dL
Ketones, ur: NEGATIVE mg/dL
LEUKOCYTES UA: NEGATIVE
Nitrite: NEGATIVE
PROTEIN: NEGATIVE mg/dL
SPECIFIC GRAVITY, URINE: 1.015 (ref 1.005–1.030)
UROBILINOGEN UA: 0.2 mg/dL (ref 0.0–1.0)
pH: 6 (ref 5.0–8.0)

## 2014-03-06 LAB — CBC WITH DIFFERENTIAL/PLATELET
BASOS ABS: 0 10*3/uL (ref 0.0–0.1)
BASOS PCT: 0 % (ref 0–1)
EOS PCT: 1 % (ref 0–5)
Eosinophils Absolute: 0.1 10*3/uL (ref 0.0–0.7)
HEMATOCRIT: 36.7 % — AB (ref 39.0–52.0)
Hemoglobin: 12.1 g/dL — ABNORMAL LOW (ref 13.0–17.0)
Lymphocytes Relative: 17 % (ref 12–46)
Lymphs Abs: 1.8 10*3/uL (ref 0.7–4.0)
MCH: 31.8 pg (ref 26.0–34.0)
MCHC: 33 g/dL (ref 30.0–36.0)
MCV: 96.6 fL (ref 78.0–100.0)
MONO ABS: 1.1 10*3/uL — AB (ref 0.1–1.0)
Monocytes Relative: 10 % (ref 3–12)
Neutro Abs: 8.1 10*3/uL — ABNORMAL HIGH (ref 1.7–7.7)
Neutrophils Relative %: 72 % (ref 43–77)
Platelets: 167 10*3/uL (ref 150–400)
RBC: 3.8 MIL/uL — ABNORMAL LOW (ref 4.22–5.81)
RDW: 13.8 % (ref 11.5–15.5)
WBC: 11 10*3/uL — ABNORMAL HIGH (ref 4.0–10.5)

## 2014-03-06 LAB — URINE MICROSCOPIC-ADD ON

## 2014-03-06 LAB — SODIUM, URINE, RANDOM: SODIUM UR: 53 meq/L

## 2014-03-06 LAB — BASIC METABOLIC PANEL
BUN: 38 mg/dL — ABNORMAL HIGH (ref 6–23)
CHLORIDE: 96 meq/L (ref 96–112)
CO2: 28 meq/L (ref 19–32)
CREATININE: 1.48 mg/dL — AB (ref 0.50–1.35)
Calcium: 9 mg/dL (ref 8.4–10.5)
GFR calc Af Amer: 61 mL/min — ABNORMAL LOW (ref >90)
GFR calc non Af Amer: 53 mL/min — ABNORMAL LOW (ref >90)
Glucose, Bld: 107 mg/dL — ABNORMAL HIGH (ref 70–99)
Potassium: 5 mEq/L (ref 3.7–5.3)
Sodium: 135 mEq/L — ABNORMAL LOW (ref 137–147)

## 2014-03-06 LAB — OSMOLALITY: OSMOLALITY: 287 mosm/kg (ref 275–300)

## 2014-03-06 LAB — OSMOLALITY, URINE: Osmolality, Ur: 435 mOsm/kg (ref 390–1090)

## 2014-03-06 MED ORDER — PREDNISONE 20 MG PO TABS
20.0000 mg | ORAL_TABLET | Freq: Every day | ORAL | Status: DC
  Administered 2014-03-07: 20 mg via ORAL
  Filled 2014-03-06: qty 1

## 2014-03-06 MED ORDER — SODIUM CHLORIDE 0.9 % IV SOLN
INTRAVENOUS | Status: AC
  Administered 2014-03-06: 09:00:00 via INTRAVENOUS

## 2014-03-06 NOTE — Progress Notes (Signed)
Ecchymosis along left flank resolving. Patient states abdominal pain is easing. Anticipate discharge today. Had shortness of breath due to Flexeril reaction. We'll see my office as an outpatient as needed.

## 2014-03-06 NOTE — Progress Notes (Signed)
UR Completed.  [name], Brandon Kaiser [phone] 03/06/2014  

## 2014-03-06 NOTE — Progress Notes (Signed)
PATIENT DETAILS Name: Brandon MausRandy Kaiser Age: [age] y.o. Sex: male Date of Birth: December 02, 1960 Admit Date: 03/03/2014 Admitting Physician Ron ParkerHarvette C Jenkins, MD ZOX:[id number]:BLUTH, Lyda PeroneKIRK, MD  Subjective:  Sitting in chair, in no distress, denies any headache, no chest pain cough phlegm, improve shortness of breath. No abdominal pain. Minimal left flank discomfort. No focal weakness.  Assessment/Plan:     Rectus sheath hematoma - Supportive care, with narcotics and Flexeril.  Aspirin/Plavix being held-history of CAD status post CABG in 2002 but no history of recent PCI. Not sure why he is on both aspirin and Plavix at this time, denies history of CVA/TIA. - Appreciate Surgery input. Hemoglobin continues to be stable.     Acute renal failure.  Hold Lasix and ACE inhibitor, check urine electrolytes, gentle hydration, repeat BMP in the morning.     Acute hypoxic respiratory failure - Suspect secondary to COPD exacerbation. Not sure whether mild diastolic CHF contributing. - improved on prednisone, scheduled nebs, incentive spirometry and empiric Levaquin.     COPD with exacerbation - As above. - Wean off oxygen as tolerated, continue with nebs, prednisone and Levaquin. -Wheezing is minimal we'll taper down steroids.     Fever - Febrile last night, increasing leukocytosis- although could be explained by hematoma-his wheezing, suspect pulmonary issue contributing. - Improved on Levaquin which will be continued.      Dyslipidemia - Continue statin and niacin    Hypertension - DC lisinopril due to ARF, BP stable    History of CAD status post CABG in 2002 - Aspirin/Plavix on hold-see above. Continue statins. - Currently with no chest pain     Anxiety disorder - Continue Xanax      Tobacco use disorder - Counseled extensively     Disposition: Remain inpatient- Home in next 1-2 days     DVT Prophylaxis:  SCD's  Code Status: Full code  Family  Communication Spouse at bedside  Procedures:  None  CONSULTS:  general surgery  Time spent 40 minutes-which includes 50% of the time with face-to-face with patient/ family and coordinating care related to the above assessment and plan.    MEDICATIONS: Scheduled Meds: . albuterol  3 mL Inhalation QID  . ALPRAZolam  1 mg Oral BID  . docusate sodium  100 mg Oral QHS  . fluticasone  1 spray Each Nare Daily  . HYDROcodone-acetaminophen  0.5 tablet Oral QID  . levofloxacin  750 mg Oral Daily  . loratadine  10 mg Oral Daily  . niacin  500 mg Oral Q breakfast  . olopatadine  1 drop Both Eyes BID  . predniSONE  40 mg Oral Q breakfast  . simvastatin  40 mg Oral QHS   Continuous Infusions: . sodium chloride 50 mL/hr at 03/06/14 0842   PRN Meds:.acetaminophen, albuterol, alum & mag hydroxide-simeth, cyclobenzaprine, HYDROmorphone (DILAUDID) injection, ondansetron (ZOFRAN) IV, ondansetron, oxyCODONE  Antibiotics: Anti-infectives   Start     Dose/Rate Route Frequency Ordered Stop   03/05/14 1500  levofloxacin (LEVAQUIN) tablet 750 mg     750 mg Oral Daily 03/05/14 1309         PHYSICAL EXAM: Vital signs in last 24 hours: Filed Vitals:   03/05/14 1934 03/05/14 2008 03/06/14 0550 03/06/14 0700  BP: 115/75  103/59   Pulse: 100  89   Temp: 98 F (36.7 C)  99.4 F (37.4 C)   TempSrc: Oral  Oral   Resp: 20  20   Height:  Weight:      SpO2: 96% 93% 94% 91%    Weight change:  Filed Weights   03/03/14 2137 03/04/14 0619  Weight: 138.347 kg (305 lb) 137.939 kg (304 lb 1.6 oz)   Body mass index is 38.01 kg/(m^2).   Gen Exam: Awake and alert with clear speech.   Neck: Supple, No JVD.   Chest: Good air entry bilaterally, rhonchi all over. CVS: S1 S2 Regular, no murmurs.  Abdomen: soft, BS +, non tender, non distended. Significant ecchymosis on the left flank area. Extremities: no edema, lower extremities warm to touch. Neurologic: Non Focal.   Skin: No Rash.     Wounds: N/A.   Intake/Output from previous day:  Intake/Output Summary (Last 24 hours) at 03/06/14 1006 Last data filed at 03/06/14 1610  Gross per 24 hour  Intake   1100 ml  Output   1125 ml  Net    -25 ml     LAB RESULTS: CBC  Recent Labs Lab 03/03/14 2245  03/04/14 0530 03/04/14 1304 03/04/14 2046 03/05/14 0519 03/06/14 0533  WBC 11.5*  --  13.3*  --   --  16.2* 11.0*  HGB 15.7  < > 14.2 13.4 13.6 12.9* 12.1*  HCT 46.8  < > 42.8 40.5 41.0 39.8 36.7*  PLT 195  --  203  --   --  176 167  MCV 95.3  --  96.0  --   --  96.8 96.6  MCH 32.0  --  31.8  --   --  31.4 31.8  MCHC 33.5  --  33.2  --   --  32.4 33.0  RDW 13.7  --  13.8  --   --  13.9 13.8  LYMPHSABS 1.4  --   --   --   --   --  1.8  MONOABS 0.5  --   --   --   --   --  1.1*  EOSABS 0.0  --   --   --   --   --  0.1  BASOSABS 0.0  --   --   --   --   --  0.0  < > = values in this interval not displayed.  Chemistries   Recent Labs Lab 03/03/14 2245 03/04/14 0530 03/05/14 0519 03/06/14 0533  NA 137 136* 136* 135*  K 5.3 5.1 5.2 5.0  CL 100 98 99 96  CO2 26 29 27 28   GLUCOSE 159* 107* 110* 107*  BUN 17 15 23  38*  CREATININE 0.88 0.76 0.96 1.48*  CALCIUM 9.2 8.7 8.8 9.0    CBG: No results found for this basename: GLUCAP,  in the last 168 hours  GFR Estimated Creatinine Clearance: 87.5 ml/min (by C-G formula based on Cr of 1.48).  Coagulation profile  Recent Labs Lab 03/03/14 2347  INR 1.13    Cardiac Enzymes  Recent Labs Lab 03/03/14 2245  TROPONINI <0.30    No components found with this basename: POCBNP,  No results found for this basename: DDIMER,  in the last 72 hours No results found for this basename: HGBA1C,  in the last 72 hours No results found for this basename: CHOL, HDL, LDLCALC, TRIG, CHOLHDL, LDLDIRECT,  in the last 72 hours No results found for this basename: TSH, T4TOTAL, FREET3, T3FREE, THYROIDAB,  in the last 72 hours No results found for this basename: VITAMINB12,  FOLATE, FERRITIN, TIBC, IRON, RETICCTPCT,  in the last 72 hours  Recent Labs  03/03/14 2245  LIPASE 31    Urine Studies No results found for this basename: UACOL, UAPR, USPG, UPH, UTP, UGL, UKET, UBIL, UHGB, UNIT, UROB, ULEU, UEPI, UWBC, URBC, UBAC, CAST, CRYS, UCOM, BILUA,  in the last 72 hours  MICROBIOLOGY: No results found for this or any previous visit (from the past 240 hour(s)).  RADIOLOGY STUDIES/RESULTS: Dg Chest 2 View  03/05/2014   CLINICAL DATA:  ?pna  EXAM: CHEST  2 VIEW  COMPARISON:  Two view chest dated 02/08/2014  FINDINGS: Low lung volumes. Cardiac silhouette is enlarged. Stable post median sternotomy and coronary artery bypass graft changes. There is prominence of the interstitial markings and peribronchial cuffing. Areas of increased density within the lung bases and perihilar regions. No focal regions consolidation. No acute osseous abnormalities.  IMPRESSION: Interstitial infiltrates likely representing pulmonary edema. An underlying component of scarring versus atelectasis within the lung bases.   Electronically Signed   By: Salome HolmesHector  Cooper M.D.   On: 03/05/2014 12:37   Ct Abdomen Pelvis W Contrast  03/04/2014   CLINICAL DATA:  Left side pain with swelling. Recent coughing. Left abdominal pain.  EXAM: CT ABDOMEN AND PELVIS WITH CONTRAST  TECHNIQUE: Multidetector CT imaging of the abdomen and pelvis was performed using the standard protocol following bolus administration of intravenous contrast.  CONTRAST:  50mL OMNIPAQUE IOHEXOL 300 MG/ML SOLN, 100mL OMNIPAQUE IOHEXOL 300 MG/ML SOLN  COMPARISON:  Report from 09/03/2004  FINDINGS: Airway thickening in both lower lobes, right greater than left.  Diffuse hepatic steatosis. The spleen, pancreas, and adrenal glands appear normal.  Densities in the collecting systems bilaterally favor early contrast excretion over stones. This early contrast excretion could obscure small nonobstructive calculi.  Aortoiliac atherosclerotic vascular  disease. No dilated bowel. Appendix poorly seen. Small umbilical hernia contains adipose tissue. No free pelvic fluid. Urinary bladder unremarkable.  Disc bulges are present at L3-4 and L4-5. There may be bilateral foraminal stenosis at the L4-5 level. Degenerative facet arthropathy noted at the L4-5 level, right greater than left.  Left rectus sheath hematoma, 6.9 x 13.7 by 22.0 cm. There is also swelling of the left lateral abdominal wall musculature, which is thicker than the right suspicious for some hematoma tracking along the lateral wall musculature is well. I do not see a left lower rib fracture.  IMPRESSION: 1. Large left rectus sheath hematoma, 22 cm x 13.7 cm x 6.9 cm. There is also likely some hematoma in the left lateral abdominal wall musculature, although much less than in the rectus sheath. 2. Lumbar disc bulges, potentially causing foraminal impingement at L4-5. 3. Atherosclerosis. 4. Airway thickening in the lower lobes, right greater than left, query bronchitis. 5. Diffuse hepatic steatosis. 6. Low sensitivity for nonobstructive renal calculi due to early contrast excretion in the collecting systems.   Electronically Signed   By: Herbie BaltimoreWalt  Liebkemann M.D.   On: 03/04/2014 00:19    Leroy SeaSINGH,[place]radford Schnitker K, MD  [facility] Pager:[phone]  If 7PM-7AM, please contact night-coverage [email or web address] Password [email or web address] 03/06/2014, 10:06 AM   LOS: 3 days   **Disclaimer: This note may have been dictated with voice recognition software. Similar sounding words can inadvertently be transcribed and this note may contain transcription errors which may not have been corrected upon publication of note.**

## 2014-03-07 LAB — BASIC METABOLIC PANEL
BUN: 28 mg/dL — ABNORMAL HIGH (ref 6–23)
CO2: 28 meq/L (ref 19–32)
CREATININE: 0.86 mg/dL (ref 0.50–1.35)
Calcium: 9.7 mg/dL (ref 8.4–10.5)
Chloride: 100 mEq/L (ref 96–112)
GFR calc Af Amer: >90 mL/min (ref >90)
GFR calc non Af Amer: >90 mL/min (ref >90)
GLUCOSE: 131 mg/dL — AB (ref 70–99)
Potassium: 5 mEq/L (ref 3.7–5.3)
Sodium: 139 mEq/L (ref 137–147)

## 2014-03-07 LAB — URINE CULTURE: Colony Count: 3000

## 2014-03-07 MED ORDER — LEVOFLOXACIN 750 MG PO TABS: 750.0000 mg | ORAL_TABLET | Freq: Every day | ORAL | Status: DC

## 2014-03-07 MED ORDER — CYCLOBENZAPRINE HCL 5 MG PO TABS: 5.0000 mg | ORAL_TABLET | Freq: Three times a day (TID) | ORAL | Status: DC | PRN

## 2014-03-07 MED ORDER — LISINOPRIL 10 MG PO TABS: 10.0000 mg | ORAL_TABLET | Freq: Every morning | ORAL | Status: AC

## 2014-03-07 MED ORDER — ALBUTEROL SULFATE HFA 108 (90 BASE) MCG/ACT IN AERS: 2.0000 | INHALATION_SPRAY | RESPIRATORY_TRACT | Status: AC | PRN

## 2014-03-07 MED ORDER — OXYCODONE HCL 5 MG PO TABS: 5.0000 mg | ORAL_TABLET | Freq: Four times a day (QID) | ORAL | Status: DC | PRN

## 2014-03-07 MED ORDER — ALBUTEROL SULFATE (2.5 MG/3ML) 0.083% IN NEBU: 3.0000 mL | INHALATION_SOLUTION | Freq: Two times a day (BID) | RESPIRATORY_TRACT | Status: DC

## 2014-03-07 NOTE — Discharge Instructions (Signed)
Follow with Primary MD Ernestine ConradBLUTH, KIRK, MD in 7 days   Get CBC, CMP, 2 view chest x-ray checked  by Primary MD next visit.    Activity: As tolerated with Full fall precautions use walker/cane & assistance as needed   Disposition Home     Diet: Heart Healthy    For Heart failure patients - Check your Weight same time everyday, if you gain over 2 pounds, or you develop in leg swelling, experience more shortness of breath or chest pain, call your Primary MD immediately. Follow Cardiac Low Salt Diet and 1.8 lit/day fluid restriction.   On your next visit with her primary care physician please Get Medicines reviewed and adjusted.  Please request your Prim.MD to go over all Hospital Tests and Procedure/Radiological results at the follow up, please get all Hospital records sent to your Prim MD by signing hospital release before you go home.   If you experience worsening of your admission symptoms, develop shortness of breath, life threatening emergency, suicidal or homicidal thoughts you must seek medical attention immediately by calling 911 or calling your MD immediately  if symptoms less severe.  You Must read complete instructions/literature along with all the possible adverse reactions/side effects for all the Medicines you take and that have been prescribed to you. Take any new Medicines after you have completely understood and accpet all the possible adverse reactions/side effects.   Do not drive, operating heavy machinery, perform activities at heights, swimming or participation in water activities or provide baby sitting services if your were admitted for syncope or siezures until you have seen by Primary MD or a Neurologist and advised to do so again.  Do not drive when taking Pain medications.    Do not take more than prescribed Pain, Sleep and Anxiety Medications  Special Instructions: If you have smoked or chewed Tobacco  in the last 2 yrs please stop smoking, stop any regular  Alcohol  and or any Recreational drug use.  Wear Seat belts while driving.   Please note  You were cared for by a hospitalist during your hospital stay. If you have any questions about your discharge medications or the care you received while you were in the hospital after you are discharged, you can call the unit and asked to speak with the hospitalist on call if the hospitalist that took care of you is not available. Once you are discharged, your primary care physician will handle any further medical issues. Please note that NO REFILLS for any discharge medications will be authorized once you are discharged, as it is imperative that you return to your primary care physician (or establish a relationship with a primary care physician if you do not have one) for your aftercare needs so that they can reassess your need for medications and monitor your lab values.

## 2014-03-07 NOTE — Progress Notes (Signed)
Client is stable at this time; continues to have bruise to left side. Client discharging to home with wife. Discharge medications reviewed, discharge instructions reviewed, understanding voiced. Written prescription given for OxyIR. [facility]

## 2014-03-07 NOTE — Discharge Summary (Signed)
Brandon Kaiser, is a 53 y.o. male  DOB 1961/05/26  MRN [id number].  Admission date:  03/03/2014  Admitting Physician  Ron ParkerHarvette C Jenkins, MD  Discharge Date:  03/07/2014   Primary MD  Ernestine ConradBLUTH, KIRK, MD  Recommendations for primary care physician for things to follow:   Kindly repeat CBC, BMP and a 2 view chest x-ray in a week.  And must follow with requested cardiologist and surgeon in a timely fashion   Admission Diagnosis  Tobacco use disorder [305.1] Hematoma [924.9] Dyslipidemia [272.4] Rectus sheath hematoma, initial encounter [922.2] Abdominal pain, unspecified abdominal location [789.00] Coronary artery disease involving native coronary artery with other forms of angina pectoris [414.01, 413.9]   Discharge Diagnosis  Tobacco use disorder [305.1] Hematoma [924.9] Dyslipidemia [272.4] Rectus sheath hematoma, initial encounter [922.2] Abdominal pain, unspecified abdominal location [789.00] Coronary artery disease involving native coronary artery with other forms of angina pectoris [414.01, 413.9]    Principal Problem:   Rectus sheath hematoma Active Problems:   Dyslipidemia   Tobacco use disorder   CAD (coronary artery disease)   Bulging lumbar disc   HTN (hypertension)   Hepatic steatosis   Acute renal failure      Past Medical History  Diagnosis Date  . Dyslipidemia   . CAD (coronary artery disease)   . Tobacco use disorder   . Anxiety state, unspecified   . Esophageal reflux   . Allergic rhinitis, cause unspecified   . Hx of CABG     2002, Dr. Dorris FetchHendrickson, X5  . RBBB (right bundle branch block)     Noted October, 2012  . Pneumonia   . HTN (hypertension)   . Bulging lumbar disc 03/04/2014  . Hepatic steatosis 03/04/2014    Past Surgical History  Procedure Laterality Date  . Coronary artery bypass graft   2002    5 VESSEL   . Cardiac catheterization       Discharge Condition: Stable   Follow UP  Follow-up Information   Follow up with Dalia HeadingJENKINS,MARK A, MD. Schedule an appointment as soon as possible for a visit on 03/16/2014.   Specialty:  General Surgery   Contact information:   [address] [place] [id number] [phone]       Follow up with Ernestine ConradBLUTH, KIRK, MD. Schedule an appointment as soon as possible for a visit in 1 week.   Specialty:  Family Medicine   Contact information:   [address] [place] [id number] [phone]       Follow up with Willa RoughJeffrey Katz, MD. Schedule an appointment as soon as possible for a visit in 1 week.   Specialty:  Cardiology   Contact information:   [address] [place] [address] [phone]         Discharge Instructions  and  Discharge Medications          Discharge Instructions   Discharge instructions    Complete by:  As directed   Follow with Primary MD Ernestine ConradBLUTH, KIRK, MD in  7 days   Get CBC, CMP, 2 view chest x-ray checked  by Primary MD next visit.    Activity: As tolerated with Full fall precautions use walker/cane & assistance as needed   Disposition Home     Diet: Heart Healthy    For Heart failure patients - Check your Weight same time everyday, if you gain over 2 pounds, or you develop in leg swelling, experience more shortness of breath or chest pain, call your Primary MD immediately. Follow Cardiac Low Salt Diet and 1.8 lit/day fluid restriction.   On your next visit with her primary care physician please Get Medicines reviewed and adjusted.  Please request your Prim.MD to go over all Hospital Tests and Procedure/Radiological results at the follow up, please get all Hospital records sent to your Prim MD by signing hospital release before you go home.   If you experience worsening of your admission symptoms, develop shortness of breath, life threatening emergency, suicidal or  homicidal thoughts you must seek medical attention immediately by calling 911 or calling your MD immediately  if symptoms less severe.  You Must read complete instructions/literature along with all the possible adverse reactions/side effects for all the Medicines you take and that have been prescribed to you. Take any new Medicines after you have completely understood and accpet all the possible adverse reactions/side effects.   Do not drive, operating heavy machinery, perform activities at heights, swimming or participation in water activities or provide baby sitting services if your were admitted for syncope or siezures until you have seen by Primary MD or a Neurologist and advised to do so again.  Do not drive when taking Pain medications.    Do not take more than prescribed Pain, Sleep and Anxiety Medications  Special Instructions: If you have smoked or chewed Tobacco  in the last 2 yrs please stop smoking, stop any regular Alcohol  and or any Recreational drug use.  Wear Seat belts while driving.   Please note  You were cared for by a hospitalist during your hospital stay. If you have any questions about your discharge medications or the care you received while you were in the hospital after you are discharged, you can call the unit and asked to speak with the hospitalist on call if the hospitalist that took care of you is not available. Once you are discharged, your primary care physician will handle any further medical issues. Please note that NO REFILLS for any discharge medications will be authorized once you are discharged, as it is imperative that you return to your primary care physician (or establish a relationship with a primary care physician if you do not have one) for your aftercare needs so that they can reassess your need for medications and monitor your lab values.  Follow with Primary MD Ernestine Conrad, MD in 7 days   Get CBC, CMP, 2 view chest x-ray checked  by Primary MD next  visit.    Activity: As tolerated with Full fall precautions use walker/cane & assistance as needed   Disposition Home     Diet: Heart Healthy    For Heart failure patients - Check your Weight same time everyday, if you gain over 2 pounds, or you develop in leg swelling, experience more shortness of breath or chest pain, call your Primary MD immediately. Follow Cardiac Low Salt Diet and 1.8 lit/day fluid restriction.   On your next visit with her primary care physician please Get Medicines reviewed and adjusted.  Please  request your Prim.MD to go over all Hospital Tests and Procedure/Radiological results at the follow up, please get all Hospital records sent to your Prim MD by signing hospital release before you go home.   If you experience worsening of your admission symptoms, develop shortness of breath, life threatening emergency, suicidal or homicidal thoughts you must seek medical attention immediately by calling 911 or calling your MD immediately  if symptoms less severe.  You Must read complete instructions/literature along with all the possible adverse reactions/side effects for all the Medicines you take and that have been prescribed to you. Take any new Medicines after you have completely understood and accpet all the possible adverse reactions/side effects.   Do not drive, operating heavy machinery, perform activities at heights, swimming or participation in water activities or provide baby sitting services if your were admitted for syncope or siezures until you have seen by Primary MD or a Neurologist and advised to do so again.  Do not drive when taking Pain medications.    Do not take more than prescribed Pain, Sleep and Anxiety Medications  Special Instructions: If you have smoked or chewed Tobacco  in the last 2 yrs please stop smoking, stop any regular Alcohol  and or any Recreational drug use.  Wear Seat belts while driving.   Please note  You were cared for by a  hospitalist during your hospital stay. If you have any questions about your discharge medications or the care you received while you were in the hospital after you are discharged, you can call the unit and asked to speak with the hospitalist on call if the hospitalist that took care of you is not available. Once you are discharged, your primary care physician will handle any further medical issues. Please note that NO REFILLS for any discharge medications will be authorized once you are discharged, as it is imperative that you return to your primary care physician (or establish a relationship with a primary care physician if you do not have one) for your aftercare needs so that they can reassess your need for medications and monitor your lab values.     Increase activity slowly    Complete by:  As directed             Medication List    STOP taking these medications       aspirin EC 325 MG tablet     clopidogrel 75 MG tablet  Commonly known as:  PLAVIX      TAKE these medications       albuterol 108 (90 BASE) MCG/ACT inhaler  Commonly known as:  PROVENTIL HFA;VENTOLIN HFA  Inhale 2 puffs into the lungs every 4 (four) hours as needed for wheezing.     ALPRAZolam 1 MG tablet  Commonly known as:  XANAX  Take 1 mg by mouth 2 (two) times daily.     cetirizine 10 MG tablet  Commonly known as:  ZYRTEC  Take 10 mg by mouth every morning.     cyclobenzaprine 5 MG tablet  Commonly known as:  FLEXERIL  Take 1 tablet (5 mg total) by mouth 3 (three) times daily as needed for muscle spasms.     docusate sodium 100 MG capsule  Commonly known as:  COLACE  Take 100 mg by mouth at bedtime.     fluticasone 50 MCG/ACT nasal spray  Commonly known as:  FLONASE  Place 1 spray into the nose daily as needed for allergies.  HYDROcodone-acetaminophen 10-325 MG per tablet  Commonly known as:  NORCO  Take 0.5 tablets by mouth 4 (four) times daily.     levofloxacin 750 MG tablet  Commonly known  as:  LEVAQUIN  Take 1 tablet (750 mg total) by mouth daily.     lisinopril 10 MG tablet  Commonly known as:  PRINIVIL,ZESTRIL  Take 1 tablet (10 mg total) by mouth every morning.     niacin 500 MG tablet  Take 500 mg by mouth daily with breakfast.     oxyCODONE 5 MG immediate release tablet  Commonly known as:  Oxy IR/ROXICODONE  Take 1 tablet (5 mg total) by mouth every 6 (six) hours as needed for moderate pain.     PATADAY 0.2 % Soln  Generic drug:  Olopatadine HCl  Place 1 drop into both eyes daily.     simvastatin 40 MG tablet  Commonly known as:  ZOCOR  Take 40 mg by mouth at bedtime.          Diet and Activity recommendation: See Discharge Instructions above   Consults obtained - Surgery   Major procedures and Radiology Reports - PLEASE review detailed and final reports for all details, in brief -       Dg Chest 2 View  03/05/2014   CLINICAL DATA:  ?pna  EXAM: CHEST  2 VIEW  COMPARISON:  Two view chest dated 02/08/2014  FINDINGS: Low lung volumes. Cardiac silhouette is enlarged. Stable post median sternotomy and coronary artery bypass graft changes. There is prominence of the interstitial markings and peribronchial cuffing. Areas of increased density within the lung bases and perihilar regions. No focal regions consolidation. No acute osseous abnormalities.  IMPRESSION: Interstitial infiltrates likely representing pulmonary edema. An underlying component of scarring versus atelectasis within the lung bases.   Electronically Signed   By: Salome HolmesHector  Cooper M.D.   On: 03/05/2014 12:37   Ct Abdomen Pelvis W Contrast  03/04/2014   CLINICAL DATA:  Left side pain with swelling. Recent coughing. Left abdominal pain.  EXAM: CT ABDOMEN AND PELVIS WITH CONTRAST  TECHNIQUE: Multidetector CT imaging of the abdomen and pelvis was performed using the standard protocol following bolus administration of intravenous contrast.  CONTRAST:  50mL OMNIPAQUE IOHEXOL 300 MG/ML SOLN, 100mL  OMNIPAQUE IOHEXOL 300 MG/ML SOLN  COMPARISON:  Report from 09/03/2004  FINDINGS: Airway thickening in both lower lobes, right greater than left.  Diffuse hepatic steatosis. The spleen, pancreas, and adrenal glands appear normal.  Densities in the collecting systems bilaterally favor early contrast excretion over stones. This early contrast excretion could obscure small nonobstructive calculi.  Aortoiliac atherosclerotic vascular disease. No dilated bowel. Appendix poorly seen. Small umbilical hernia contains adipose tissue. No free pelvic fluid. Urinary bladder unremarkable.  Disc bulges are present at L3-4 and L4-5. There may be bilateral foraminal stenosis at the L4-5 level. Degenerative facet arthropathy noted at the L4-5 level, right greater than left.  Left rectus sheath hematoma, 6.9 x 13.7 by 22.0 cm. There is also swelling of the left lateral abdominal wall musculature, which is thicker than the right suspicious for some hematoma tracking along the lateral wall musculature is well. I do not see a left lower rib fracture.  IMPRESSION: 1. Large left rectus sheath hematoma, 22 cm x 13.7 cm x 6.9 cm. There is also likely some hematoma in the left lateral abdominal wall musculature, although much less than in the rectus sheath. 2. Lumbar disc bulges, potentially causing foraminal impingement at L4-5. 3. Atherosclerosis.  4. Airway thickening in the lower lobes, right greater than left, query bronchitis. 5. Diffuse hepatic steatosis. 6. Low sensitivity for nonobstructive renal calculi due to early contrast excretion in the collecting systems.   Electronically Signed   By: Herbie Baltimore M.D.   On: 03/04/2014 00:19    Micro Results      No results found for this or any previous visit (from the past 240 hour(s)).   History of present illness and  Hospital Course:     Kindly see H&P for history of present illness and admission details, please review complete Labs, Consult reports and Test reports for all  details in brief     HPI: Brandon Kaiser is a 53 y.o. male with a history of CAD S/P CABG x5, Hyperlipidemia who presents to the ED with complaints of sudden severe Left upper ABD Pain following a forceful sneeze this evening. He presented tot he ED and a CT scan of the ABD was performed which revealed a Left-sided Rectus Sheath Hematoma with dimensions of 22 cm x 14 cm x 7 cm. The EDP contacted general surgery on call to discuss , Dr. Franky Macho, who recommended observation for extension of the Heamtoma, and to observe for a continued bleeding and decrease in his hemoglobin as well as for pain control.     Hospital course   Rectus sheath hematoma  - Stabilized now. H&H stable, hematoma margins not expanding for the last 48 hours. Aspirin/Plavix being held-history of CAD status post CABG in 2002 but no history of recent PCI. Not sure why he is on both aspirin and Plavix at this time, denies history of CVA/TIA. Follow with primary cardiologist to see if he requires any antiplatelet therapy in the future. - Seen by surgery we'll follow with them outpatient post discharge.     Acute renal failure.  Secondary to dehydration, resolved after gentle IV fluids and holding her ACE inhibitor.     COPD with exacerbation with acute hypoxic respiratory failure.  - Currently no wheezing, no oxygen requirement, feels at baseline, down to 10 mg of oral prednisone which will be stopped. 5 more days of Levaquin along with refill his inhaler. Can benefit from one time outpatient already followup.    Fever  - Secondary to bronchitis, responded well to oral Levaquin which will be continued, now symptom-free without any cough shortness of breath. Exam stable. Discharged on 5 more days of Levaquin with outpatient followup with PCP.    Dyslipidemia  - Continue statin and niacin     Hypertension  - Reduced lisinopril dose due to acute renal failure and borderline high potassium during hospital stay,  monitor blood pressure BMP closely.     History of CAD status post CABG in 2002  - Aspirin/Plavix on hold-due to hematoma. Continue statins.  - Must follow with his primary cardiologist within a week.   Anxiety disorder  - Continue Xanax     Tobacco use disorder  - Counseled extensively       Today   Subjective:   Brandon Kaiser today has no headache,no chest abdominal pain,no new weakness tingling or numbness, feels much better wants to go home today.    Objective:   Blood pressure 140/60, pulse 78, temperature 98 F (36.7 C), temperature source Oral, resp. rate 20, height 6\' 3"  (1.905 m), weight 137.939 kg (304 lb 1.6 oz), SpO2 92.00%.   Intake/Output Summary (Last 24 hours) at 03/07/14 0914 Last data filed at 03/07/14 0541  Gross  per 24 hour  Intake 930.83 ml  Output   2900 ml  Net -1969.17 ml    Exam Awake Alert, Oriented x 3, No new F.N deficits, Normal affect [place].AT,PERRAL Supple Neck,No JVD, No cervical lymphadenopathy appriciated.  Symmetrical Chest wall movement, Good air movement bilaterally, CTAB RRR,No Gallops,Rubs or new Murmurs, No Parasternal Heave +ve B.Sounds, Abd Soft, Non tender, No organomegaly appriciated, No rebound -guarding or rigidity. Large left flank soft tissue bruise/hematoma noted but stable. Outer margins demarcated with a permanent marker. No Cyanosis, Clubbing or edema, No new Rash or bruise  Data Review   CBC w Diff: Lab Results  Component Value Date   WBC 11.0* 03/06/2014   HGB 12.1* 03/06/2014   HCT 36.7* 03/06/2014   PLT 167 03/06/2014   LYMPHOPCT 17 03/06/2014   MONOPCT 10 03/06/2014   EOSPCT 1 03/06/2014   BASOPCT 0 03/06/2014    CMP: Lab Results  Component Value Date   NA 139 03/07/2014   K 5.0 03/07/2014   CL 100 03/07/2014   CO2 28 03/07/2014   BUN 28* 03/07/2014   CREATININE 0.86 03/07/2014   PROT 6.8 03/03/2014   ALBUMIN 3.5 03/03/2014   BILITOT 0.5 03/03/2014   ALKPHOS 46 03/03/2014   AST 32 03/03/2014   ALT 43  03/03/2014  .   Total Time in preparing paper work, data evaluation and todays exam - 35 minutes  Leroy Sea M.D on 03/07/2014 at 9:14 AM  [facility] Office  [phone]   **Disclaimer: This note may have been dictated with voice recognition software. Similar sounding words can inadvertently be transcribed and this note may contain transcription errors which may not have been corrected upon publication of note.**

## 2015-12-18 ENCOUNTER — Encounter (HOSPITAL_COMMUNITY): Payer: Self-pay | Admitting: Emergency Medicine

## 2015-12-18 ENCOUNTER — Emergency Department (HOSPITAL_COMMUNITY)
Admission: EM | Admit: 2015-12-18 | Discharge: 2015-12-18 | Disposition: A | Discharge status: Home / Self Care | Payer: Medicaid Other | Attending: Emergency Medicine | Admitting: Emergency Medicine

## 2015-12-18 ENCOUNTER — Emergency Department (HOSPITAL_COMMUNITY): Payer: Medicaid Other

## 2015-12-18 DIAGNOSIS — E785 Hyperlipidemia, unspecified: Secondary | ICD-10-CM | POA: Insufficient documentation

## 2015-12-18 DIAGNOSIS — I2510 Atherosclerotic heart disease of native coronary artery without angina pectoris: Secondary | ICD-10-CM | POA: Diagnosis not present

## 2015-12-18 DIAGNOSIS — I10 Essential (primary) hypertension: Secondary | ICD-10-CM | POA: Insufficient documentation

## 2015-12-18 DIAGNOSIS — J209 Acute bronchitis, unspecified: Secondary | ICD-10-CM | POA: Insufficient documentation

## 2015-12-18 DIAGNOSIS — Z951 Presence of aortocoronary bypass graft: Secondary | ICD-10-CM | POA: Diagnosis not present

## 2015-12-18 DIAGNOSIS — Z87891 Personal history of nicotine dependence: Secondary | ICD-10-CM | POA: Diagnosis not present

## 2015-12-18 DIAGNOSIS — Z79899 Other long term (current) drug therapy: Secondary | ICD-10-CM | POA: Insufficient documentation

## 2015-12-18 DIAGNOSIS — R05 Cough: Secondary | ICD-10-CM | POA: Diagnosis present

## 2015-12-18 MED ORDER — DEXAMETHASONE 4 MG PO TABS
12.0000 mg | ORAL_TABLET | Freq: Once | ORAL | Status: AC
  Administered 2015-12-18: 12 mg via ORAL
  Filled 2015-12-18: qty 3

## 2015-12-18 MED ORDER — IPRATROPIUM-ALBUTEROL 0.5-2.5 (3) MG/3ML IN SOLN
3.0000 mL | Freq: Four times a day (QID) | RESPIRATORY_TRACT | Status: DC
  Administered 2015-12-18: 3 mL via RESPIRATORY_TRACT
  Filled 2015-12-18: qty 3

## 2015-12-18 MED ORDER — GUAIFENESIN-CODEINE 100-10 MG/5ML PO SOLN
10.0000 mL | Freq: Once | ORAL | Status: AC
  Administered 2015-12-18: 10 mL via ORAL
  Filled 2015-12-18: qty 10

## 2015-12-18 MED ORDER — BENZONATATE 100 MG PO CAPS: 100.0000 mg | ORAL_CAPSULE | Freq: Three times a day (TID) | ORAL | Status: AC | PRN

## 2015-12-18 MED ORDER — PREDNISONE 20 MG PO TABS: 40.0000 mg | ORAL_TABLET | Freq: Every day | ORAL | Status: AC

## 2015-12-18 NOTE — Discharge Instructions (Signed)

## 2015-12-18 NOTE — ED Notes (Signed)
Patient c/o cough with body aches and fevers. Per patient started Thursday. Denies any nausea or vomiting. Per patient taking tylenol for fevers with last dose at 12:30. Patient also reports taking mucinex and nebulizer with no relief. Per patient cough occasionally productive with thick green/yellow sputum.

## 2015-12-18 NOTE — ED Provider Notes (Signed)
CSN: [id number]     Arrival date & time 12/18/15  1341 History   First MD Initiated Contact with Patient 12/18/15 1356     Chief Complaint  Patient presents with  . Cough     (Consider location/radiation/quality/duration/timing/severity/associated sxs/prior Treatment) HPI   54yM with cough. Onset Thursday. Persistent since then. Productive for yellow/green sputum. Body aches. subjectiev fever. No unusual leg pain or swelling. No improvement with mucinex. albuterol helps briefly.   Past Medical History  Diagnosis Date  . Dyslipidemia   . CAD (coronary artery disease)   . Tobacco use disorder   . Anxiety state, unspecified   . Esophageal reflux   . Allergic rhinitis, cause unspecified   . Hx of CABG     2002, Dr. Dorris Fetch, X5  . RBBB (right bundle branch block)     Noted October, 2012  . Pneumonia   . HTN (hypertension)   . Bulging lumbar disc 03/04/2014  . Hepatic steatosis 03/04/2014   Past Surgical History  Procedure Laterality Date  . Coronary artery bypass graft  2002    5 VESSEL   . Cardiac catheterization     Family History  Problem Relation Age of Onset  . Heart attack Father 66  . Coronary artery disease      Brother and Sisters hx of CAD  . Cancer Mother     Breast Cancer   Social History  Substance Use Topics  . Smoking status: Former Smoker -- 0.50 packs/day for 32 years    Types: Cigarettes    Quit date: 03/02/2014  . Smokeless tobacco: Never Used     Comment: 50 to 60-pack year history of tobacco-STARTED BACK SMOKING  . Alcohol Use: No    Review of Systems  All systems reviewed and negative, other than as noted in HPI.    Allergies  Erythromycin and Cephalexin  Home Medications   Prior to Admission medications   Medication Sig Start Date End Date Taking? Authorizing Provider  acetaminophen (TYLENOL) 500 MG tablet Take 500 mg by mouth every 6 (six) hours as needed for mild pain.   Yes Historical Provider, MD  albuterol (PROVENTIL  HFA;VENTOLIN HFA) 108 (90 BASE) MCG/ACT inhaler Inhale 2 puffs into the lungs every 4 (four) hours as needed for wheezing. 03/07/14 02/25/16 Yes Leroy Sea, MD  ALPRAZolam Prudy Feeler) 1 MG tablet Take 1 mg by mouth 2 (two) times daily.     Yes Historical Provider, MD  budesonide-formoterol (SYMBICORT) 160-4.5 MCG/ACT inhaler Inhale 2 puffs into the lungs 2 (two) times daily.   Yes Historical Provider, MD  cetirizine (ZYRTEC) 10 MG tablet Take 10 mg by mouth every morning.    Yes Historical Provider, MD  cholecalciferol (VITAMIN D) 1000 units tablet Take 1,000 Units by mouth daily.   Yes Historical Provider, MD  clopidogrel (PLAVIX) 75 MG tablet Take 75 mg by mouth daily.   Yes Historical Provider, MD  fluticasone (FLONASE) 50 MCG/ACT nasal spray Place 1 spray into the nose daily as needed for allergies.    Yes Historical Provider, MD  HYDROcodone-acetaminophen (NORCO) 10-325 MG per tablet Take 0.5 tablets by mouth 4 (four) times daily.   Yes Historical Provider, MD  lisinopril (PRINIVIL,ZESTRIL) 10 MG tablet Take 1 tablet (10 mg total) by mouth every morning. 03/07/14  Yes Leroy Sea, MD  niacin 500 MG tablet Take 500 mg by mouth daily with breakfast.     Yes Historical Provider, MD  omeprazole (PRILOSEC) 40 MG capsule Take 40 mg  by mouth daily.   Yes Historical Provider, MD  pseudoephedrine-guaifenesin (MUCINEX D) 60-600 MG 12 hr tablet Take 1 tablet by mouth 2 (two) times daily as needed for congestion.   Yes Historical Provider, MD  simvastatin (ZOCOR) 40 MG tablet Take 40 mg by mouth at bedtime.     Yes Historical Provider, MD   BP 145/69 mmHg  Pulse 91  Temp(Src) 98.2 F (36.8 C) (Oral)  Resp 22  Ht 6' (1.829 m)  Wt 318 lb (144.244 kg)  BMI 43.12 kg/m2  SpO2 93% Physical Exam  Constitutional: He appears well-developed and well-nourished. No distress.  HENT:  Head: Normocephalic and atraumatic.  Eyes: Conjunctivae are normal. Right eye exhibits no discharge. Left eye exhibits no  discharge.  Neck: Neck supple.  Cardiovascular: Normal rate, regular rhythm and normal heart sounds.  Exam reveals no gallop and no friction rub.   No murmur heard. Pulmonary/Chest: Effort normal. No respiratory distress. He has wheezes.  Scattered expiratory wheezing  Abdominal: Soft. He exhibits no distension. There is no tenderness.  Musculoskeletal: He exhibits no edema or tenderness.  Lower extremities symmetric as compared to each other. No calf tenderness. Negative Homan's. No palpable cords.    Neurological: He is alert.  Skin: Skin is warm and dry.  Psychiatric: He has a normal mood and affect. His behavior is normal. Thought content normal.  Nursing note and vitals reviewed.   ED Course  Procedures (including critical care time) Labs Review Labs Reviewed - No data to display  Imaging Review Dg Chest 2 View  12/18/2015  CLINICAL DATA:  Cough EXAM: CHEST  2 VIEW COMPARISON:  March 05, 2014 FINDINGS: The heart is borderline. The hila and mediastinum are normal. Mild interstitial prominence. No focal infiltrate seen on the frontal view. There is increased density projected over the heart on the lateral view, not appreciated on the frontal view. This is similar when compared to the 2015 study and favored to be chronic. No other acute abnormalities. IMPRESSION: 1. Increased density project over the heart on the lateral view is similar when compared to 2015 suggesting scar or atelectasis rather than acute infiltrate. 2. Mild interstitial prominence could be seen with bronchitis, atypical infection, or mild edema. Electronically Signed   By: Gerome Samavid  Williams III M.D   On: 12/18/2015 14:37   I have personally reviewed and evaluated these images and lab results as part of my medical decision-making.   EKG Interpretation None      MDM   Final diagnoses:  Acute bronchitis, unspecified organism    72M with likely viral bronchitis. Doubt pneumonia or pe. PRN cough meds and steroids.  It has been determined that no acute conditions requiring further emergency intervention are present at this time. The patient has been advised of the diagnosis and plan. I reviewed any labs and imaging including any potential incidental findings. We have discussed signs and symptoms that warrant return to the ED and they are listed in the discharge instructions.     Raeford RazorStephen Maicie Vanderloop, MD 12/28/15 1352

## 2017-03-24 IMAGING — DX DG CHEST 2V
2 series · 2 of 2 positions shown · non-contrast
Comparison: March 05, 2014

CLINICAL DATA: Cough

EXAM:
CHEST  2 VIEW

[chest pa]
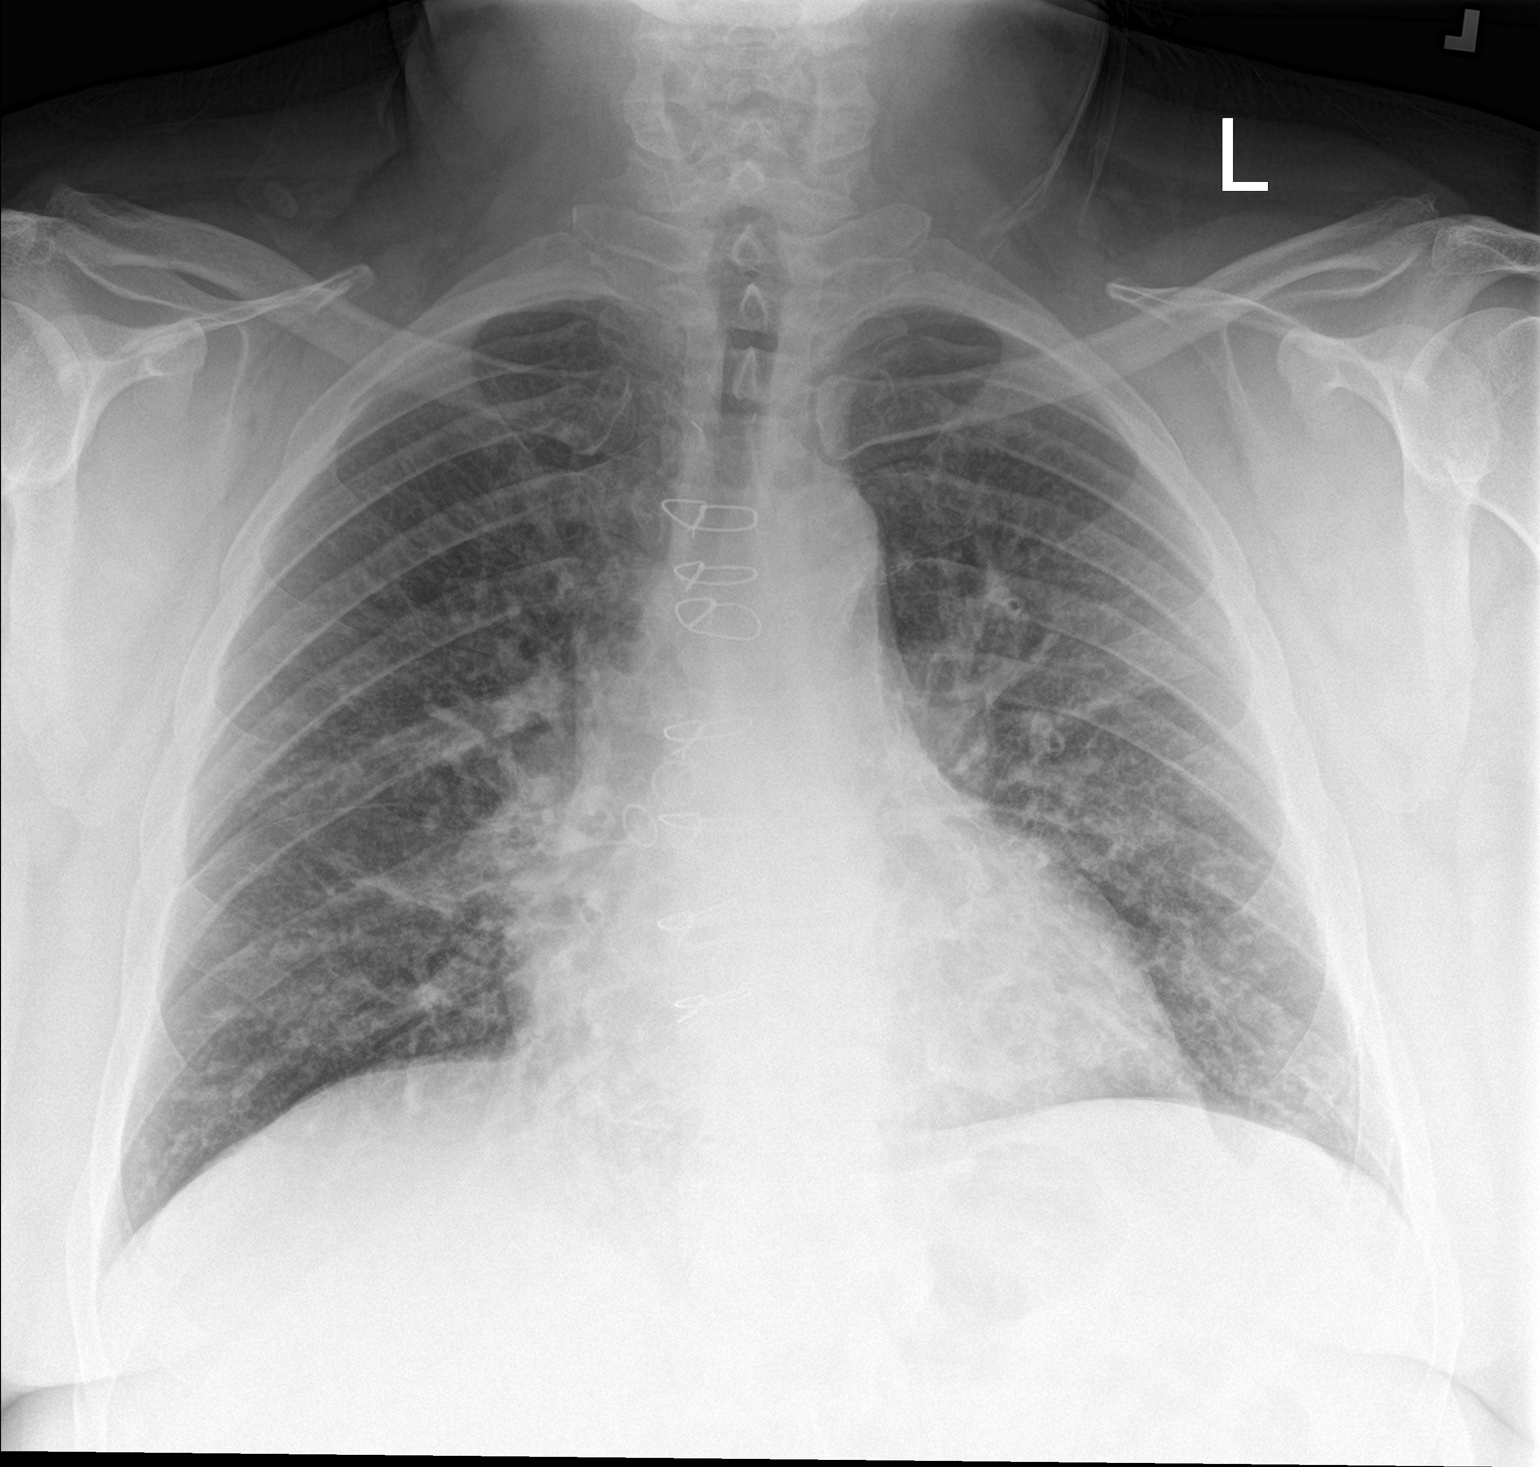

[chest lat]
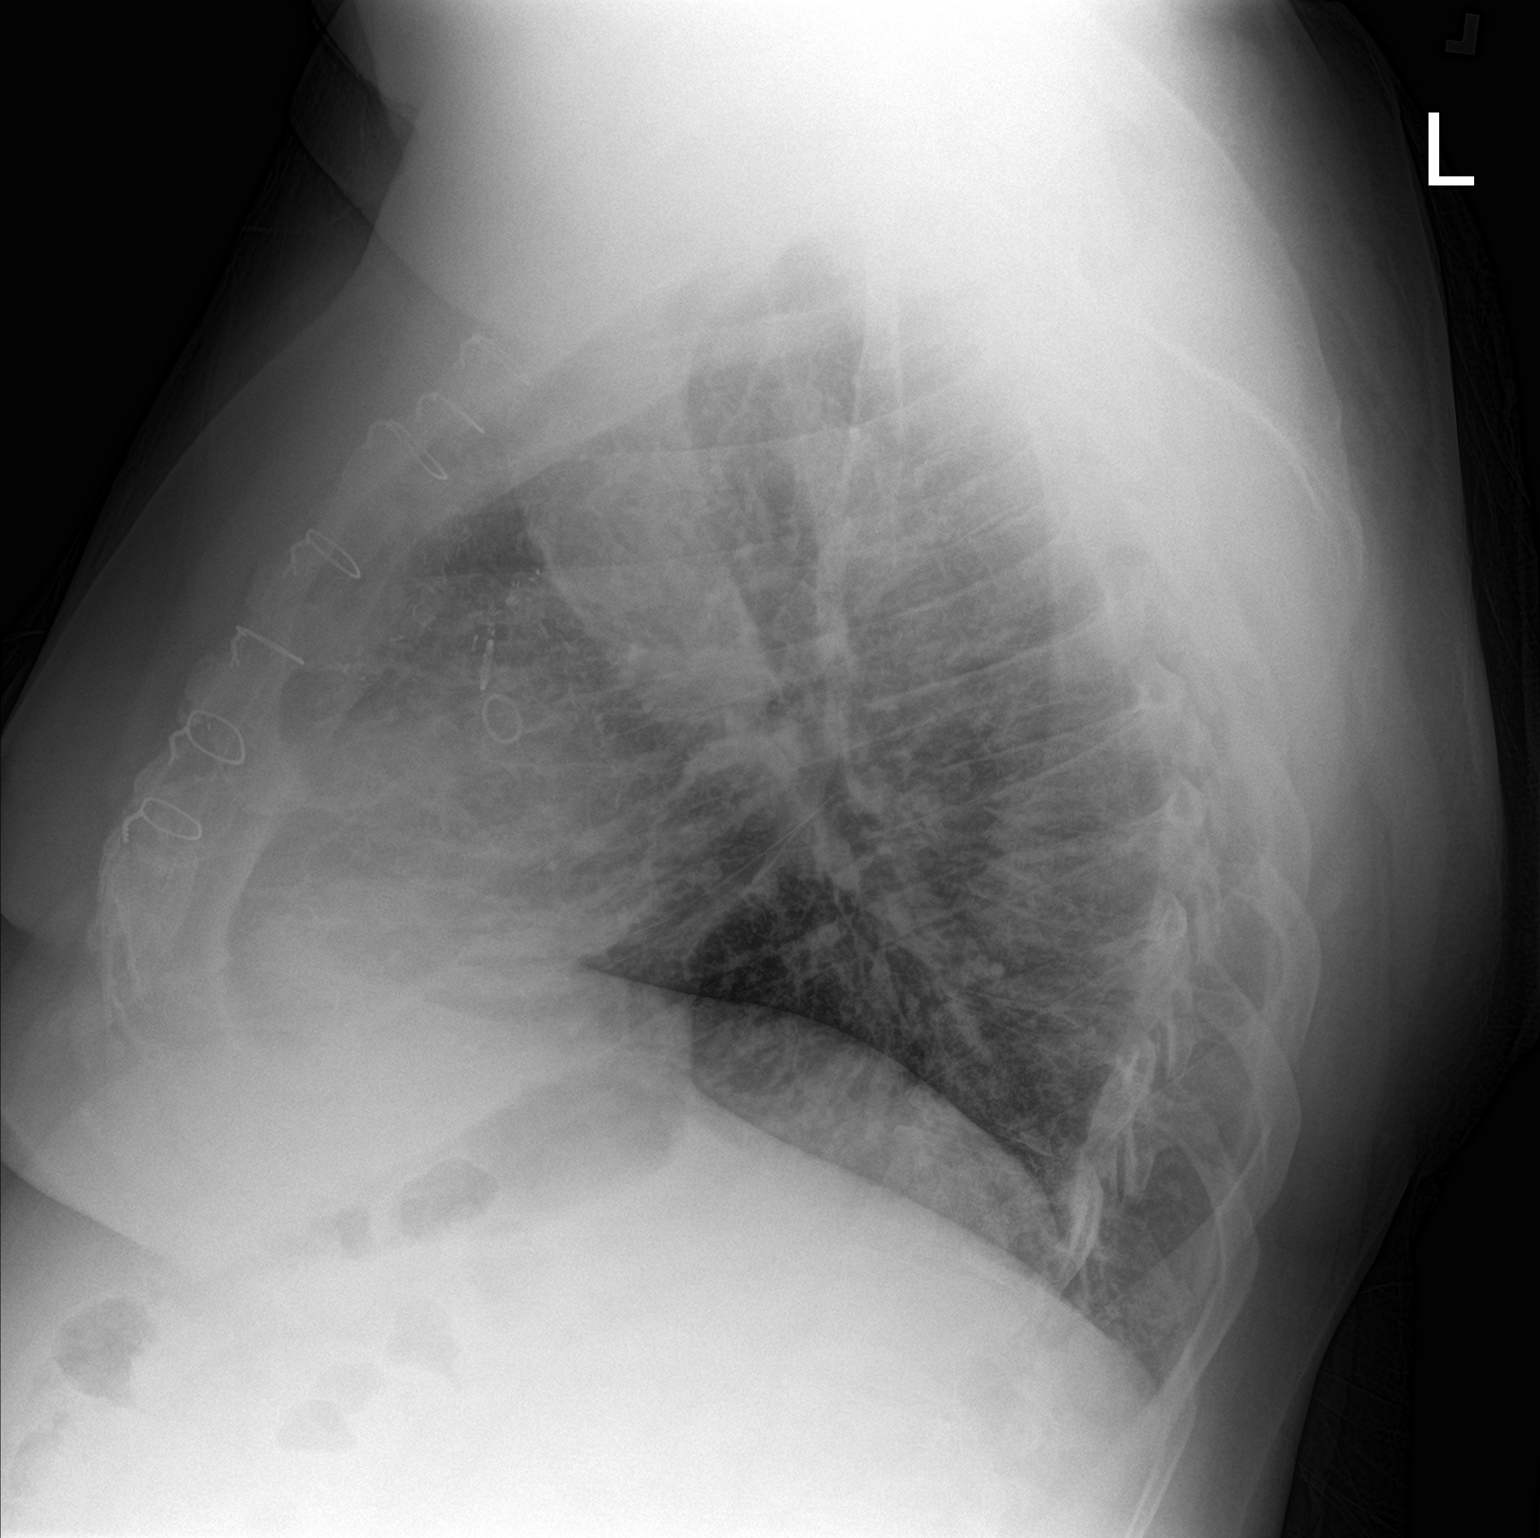

[2 of 2 positions shown; findings below may reference images not displayed]

FINDINGS: The heart is borderline. The hila and mediastinum are normal. Mild
interstitial prominence. No focal infiltrate seen on the frontal
view. There is increased density projected over the heart on the
lateral view, not appreciated on the frontal view. This is similar
when compared to the 3154 study and favored to be chronic. No other
acute abnormalities.
IMPRESSION: 1. Increased density project over the heart on the lateral view is
similar when compared to 3154 suggesting scar or atelectasis rather
than acute infiltrate.
2. Mild interstitial prominence could be seen with bronchitis,
atypical infection, or mild edema.

## 2018-11-18 ENCOUNTER — Other Ambulatory Visit (HOSPITAL_COMMUNITY): Payer: Self-pay | Admitting: Internal Medicine

## 2018-11-18 ENCOUNTER — Ambulatory Visit (HOSPITAL_COMMUNITY)
Admission: RE | Admit: 2018-11-18 | Discharge: 2018-11-18 | Disposition: A | Discharge status: Home / Self Care | Payer: Medicaid Other | Source: Ambulatory Visit | Attending: Internal Medicine | Admitting: Internal Medicine

## 2018-11-18 DIAGNOSIS — J111 Influenza due to unidentified influenza virus with other respiratory manifestations: Secondary | ICD-10-CM

## 2020-02-23 IMAGING — DX CHEST - 2 VIEW
2 series · 3 of 3 positions shown · non-contrast
Comparison: 12/18/2015

CLINICAL DATA: Cough and congestion for several months

EXAM:
CHEST - 2 VIEW

[chest pa]
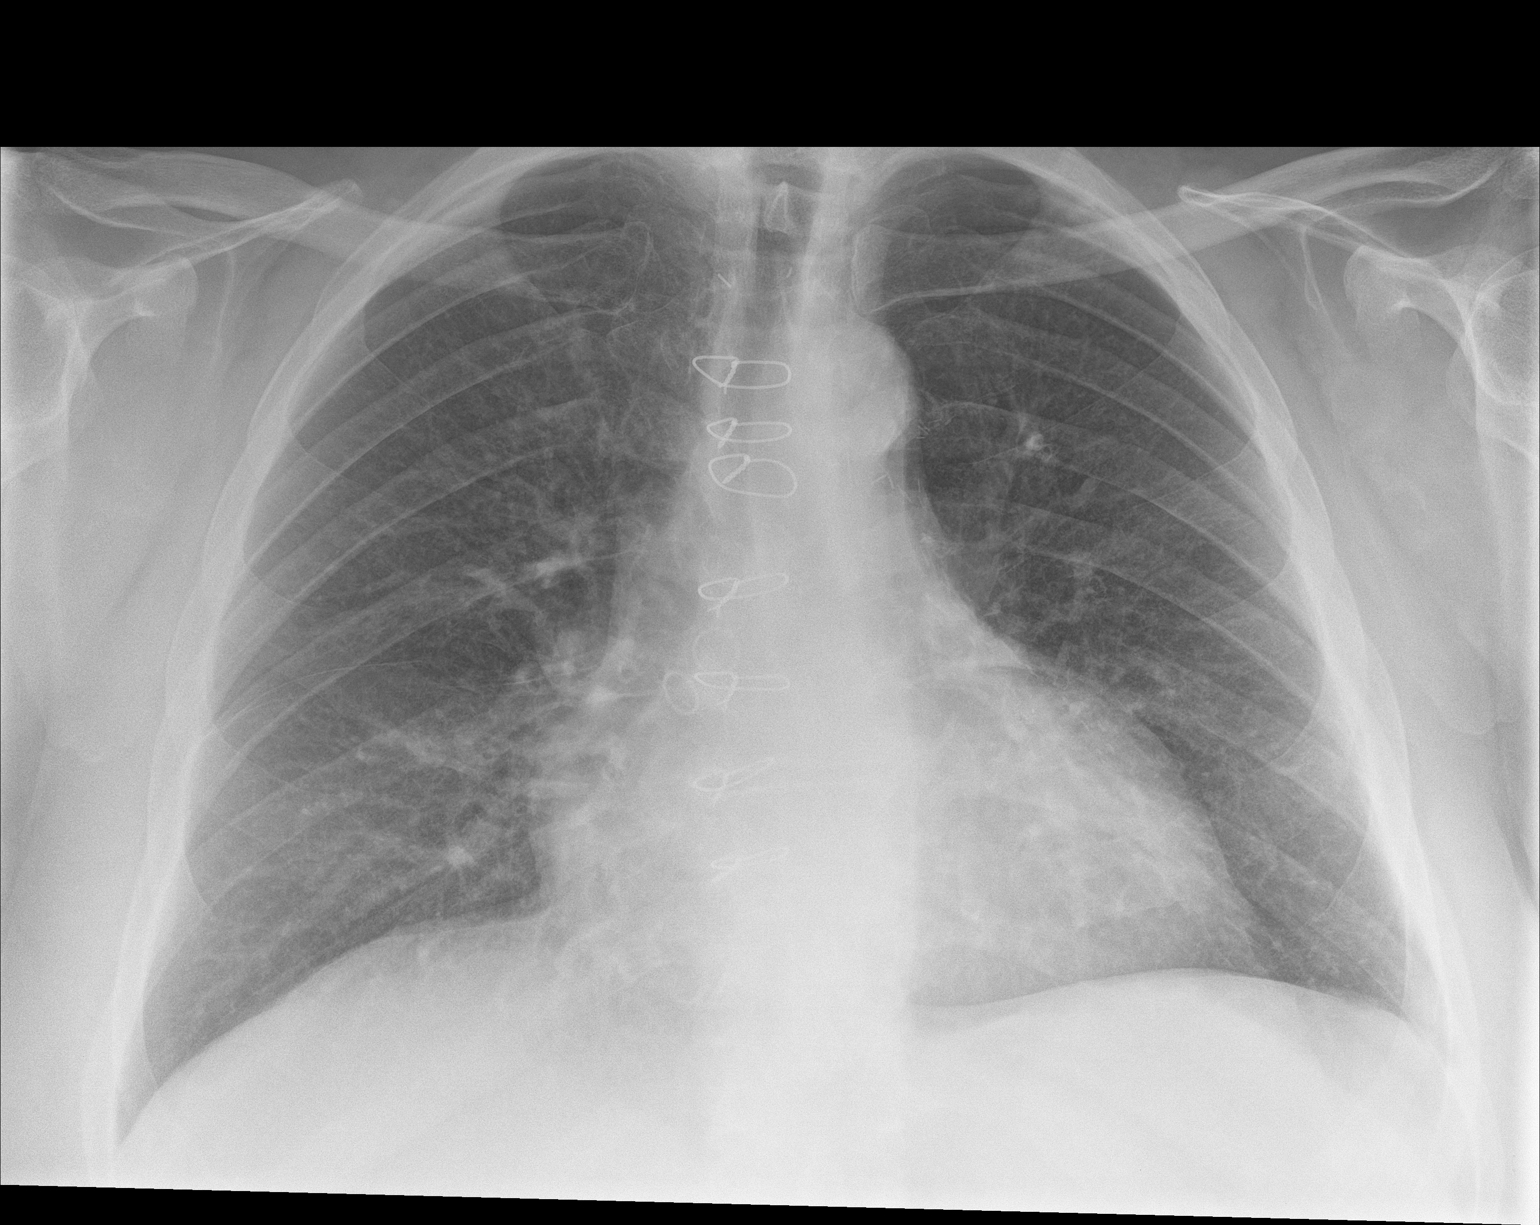

[Series 2: chest lat · 0.14mm/px · 2 of 2 slices shown]
[im 1/2]
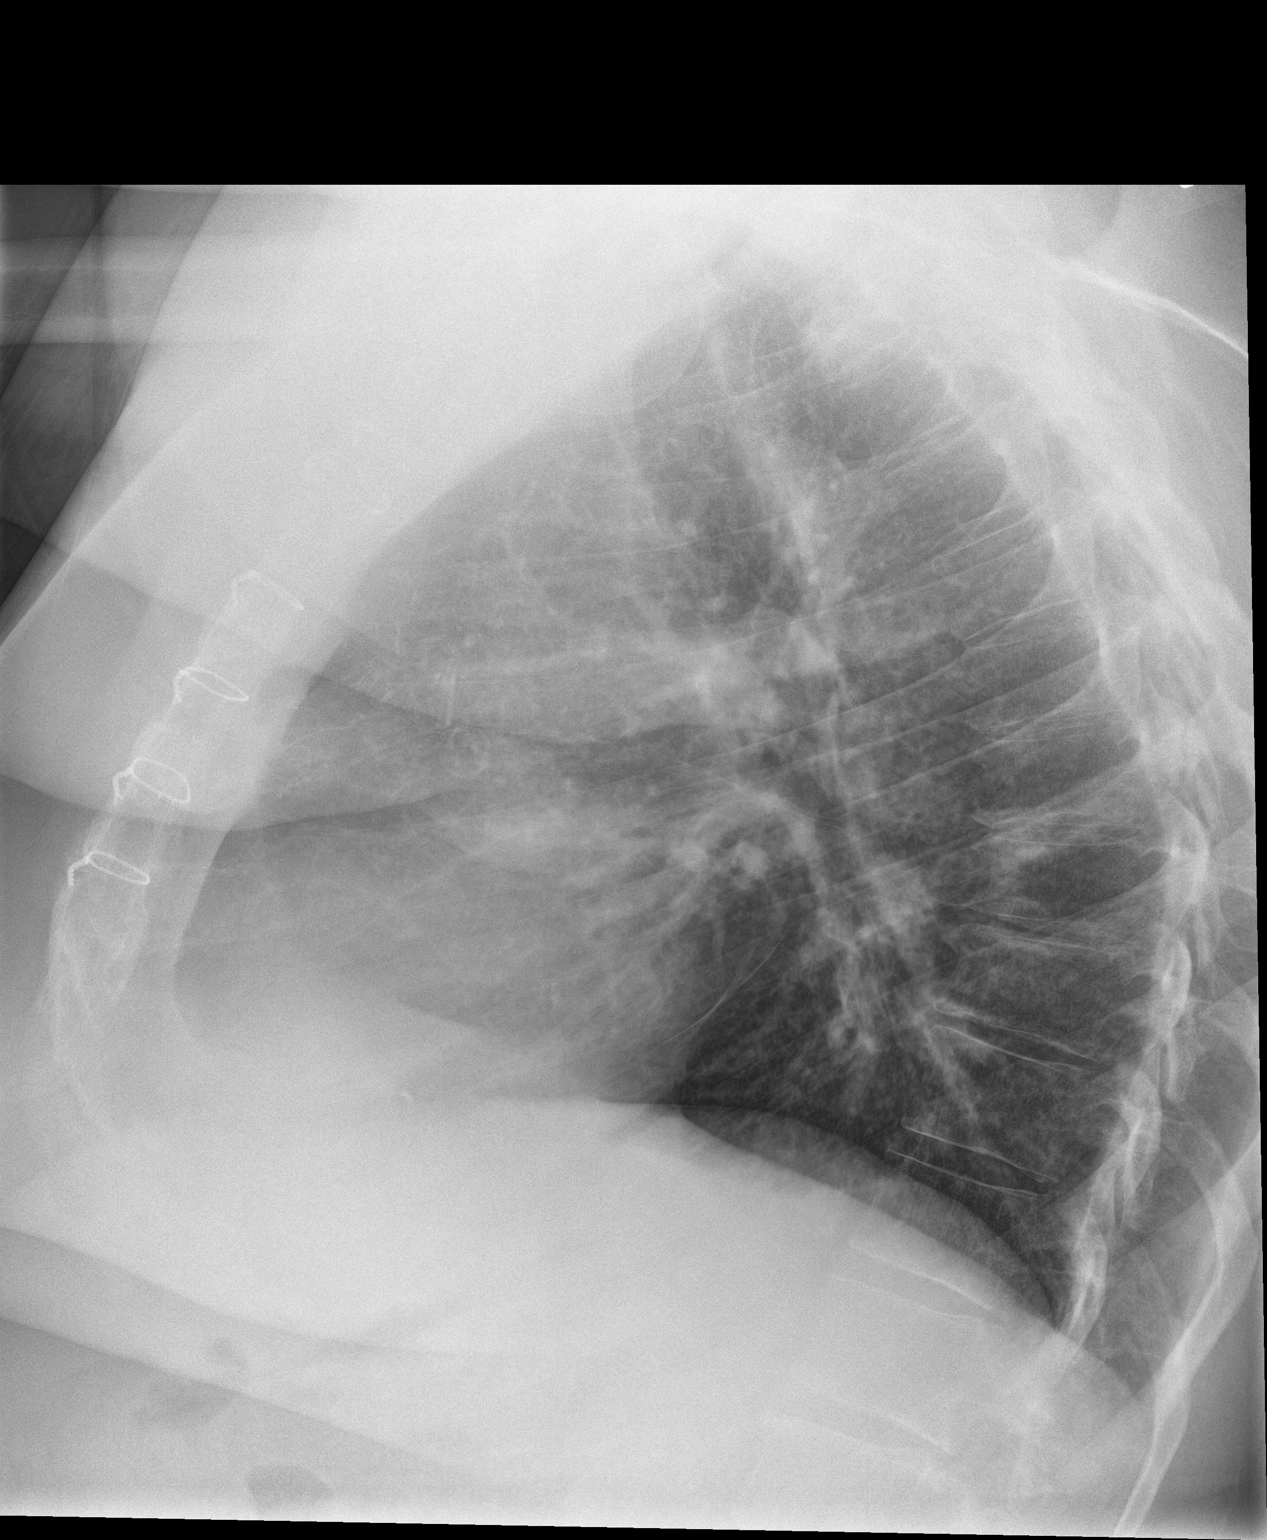
[im 2/2]
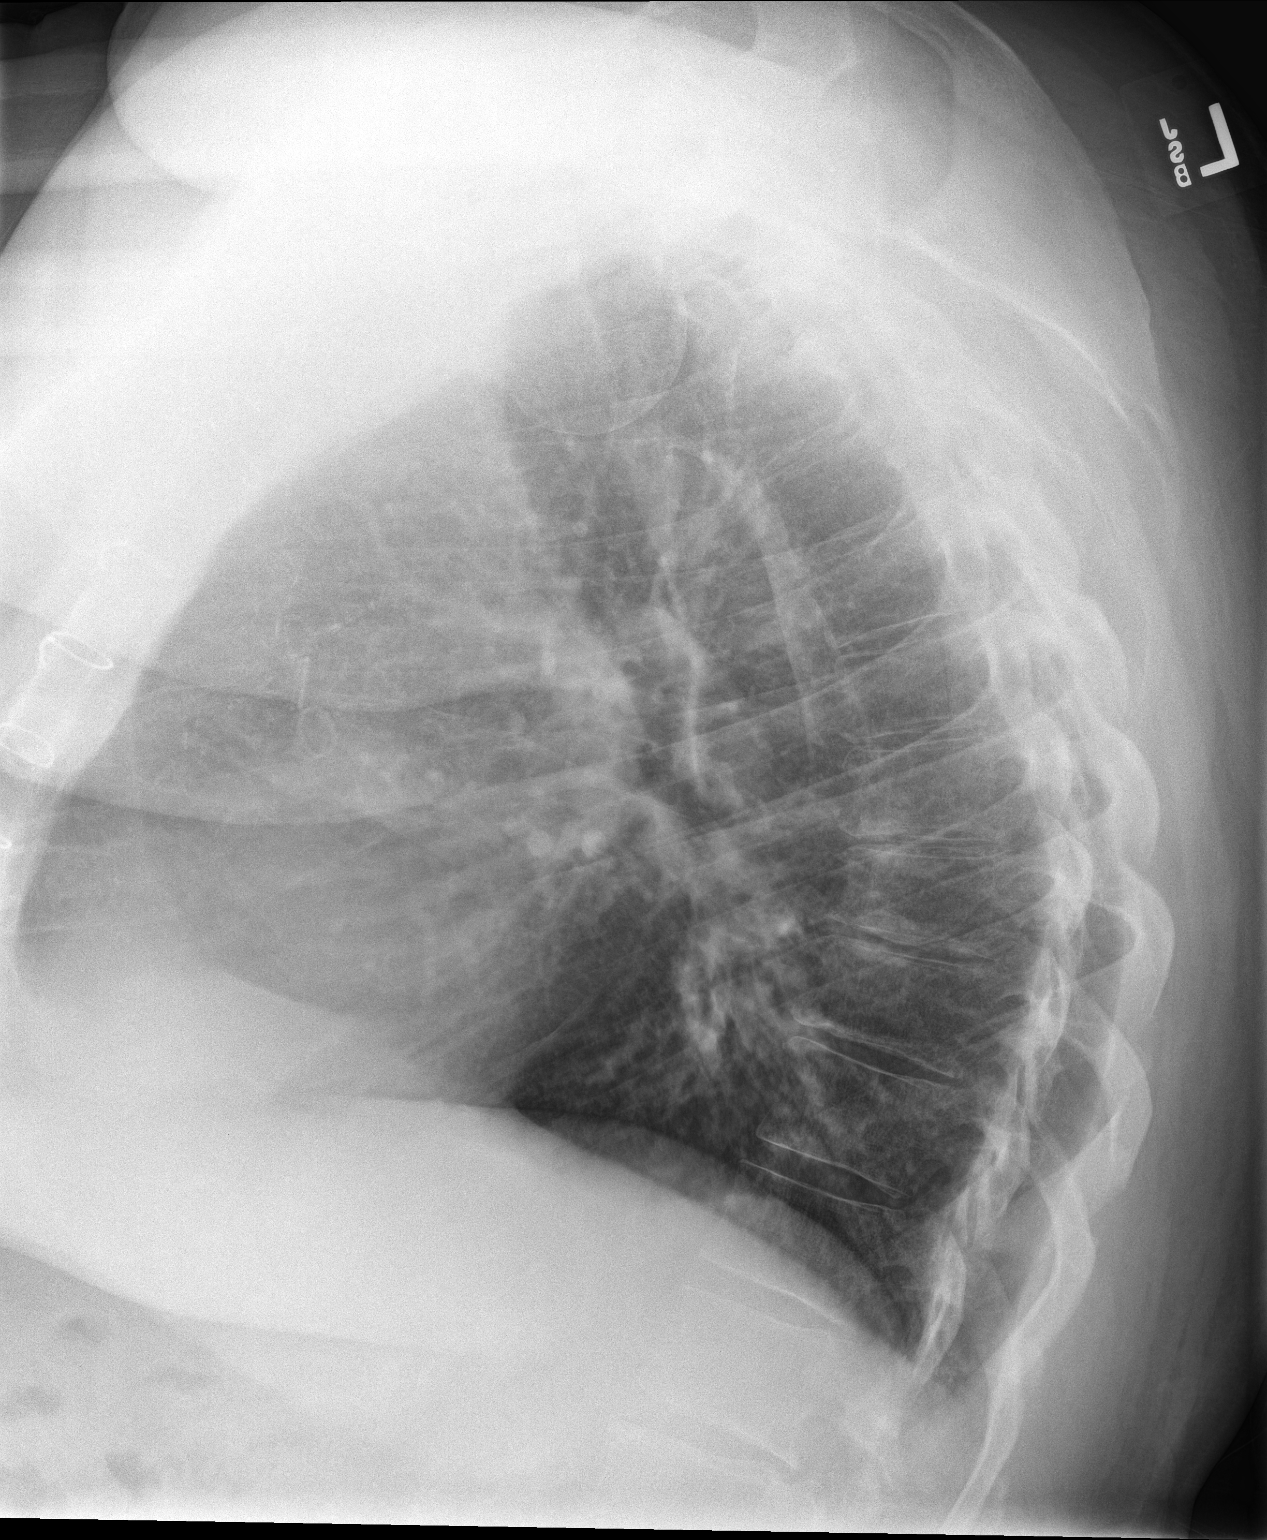

[3 of 3 positions shown; findings below may reference images not displayed]

FINDINGS: Cardiac shadow is enlarged. Postsurgical changes are noted. Lungs
are well aerated bilaterally. Previously seen interstitial
prominence has resolved in the interval. No focal infiltrate or
sizable effusion is seen. Mild degenerative changes of the thoracic
spine are noted.
IMPRESSION: No acute abnormality noted.

## 2020-09-01 ENCOUNTER — Other Ambulatory Visit (HOSPITAL_COMMUNITY): Payer: Self-pay | Admitting: Pulmonary Disease

## 2020-09-02 ENCOUNTER — Other Ambulatory Visit: Payer: Self-pay | Admitting: Nurse Practitioner

## 2020-09-02 DIAGNOSIS — F17200 Nicotine dependence, unspecified, uncomplicated: Secondary | ICD-10-CM

## 2020-09-02 DIAGNOSIS — I10 Essential (primary) hypertension: Secondary | ICD-10-CM

## 2020-09-02 DIAGNOSIS — Z951 Presence of aortocoronary bypass graft: Secondary | ICD-10-CM

## 2020-09-02 DIAGNOSIS — N179 Acute kidney failure, unspecified: Secondary | ICD-10-CM

## 2020-09-02 DIAGNOSIS — U071 COVID-19: Secondary | ICD-10-CM

## 2020-09-02 NOTE — Progress Notes (Signed)
I connected by phone with Brandon Kaiser on 09/02/2020 at 3:16 PM to discuss the potential use of a new treatment for mild to moderate COVID-19 viral infection in non-hospitalized patients.  This patient is a 59 y.o. male that meets the FDA criteria for Emergency Use Authorization of COVID monoclonal antibody casirivimab/imdevimab, bamlanivimab/etesevimab, or sotrovimab.  Has a (+) direct SARS-CoV-2 viral test result  Has mild or moderate COVID-19   Is NOT hospitalized due to COVID-19  Is within 10 days of symptom onset  Has at least one of the high risk factor(s) for progression to severe COVID-19 and/or hospitalization as defined in EUA.  Specific high risk criteria : BMI > 25, Chronic Kidney Disease (CKD) and Cardiovascular disease or hypertension   I have spoken and communicated the following to the patient or parent/caregiver regarding COVID monoclonal antibody treatment:  1. FDA has authorized the emergency use for the treatment of mild to moderate COVID-19 in adults and pediatric patients with positive results of direct SARS-CoV-2 viral testing who are 47 years of age and older weighing at least 40 kg, and who are at high risk for progressing to severe COVID-19 and/or hospitalization.  2. The significant known and potential risks and benefits of COVID monoclonal antibody, and the extent to which such potential risks and benefits are unknown.  3. Information on available alternative treatments and the risks and benefits of those alternatives, including clinical trials.  4. Patients treated with COVID monoclonal antibody should continue to self-isolate and use infection control measures (e.g., wear mask, isolate, social distance, avoid sharing personal items, clean and disinfect "high touch" surfaces, and frequent handwashing) according to CDC guidelines.   5. The patient or parent/caregiver has the option to accept or refuse COVID monoclonal antibody treatment.  After reviewing this  information with the patient, the patient has agreed to receive one of the available covid 19 monoclonal antibodies and will be provided an appropriate fact sheet prior to infusion. Mayra Reel, NP 09/02/2020 3:16 PM

## 2020-09-05 ENCOUNTER — Ambulatory Visit (HOSPITAL_COMMUNITY)
Admission: RE | Admit: 2020-09-05 | Discharge: 2020-09-05 | Disposition: A | Discharge status: Home / Self Care | Payer: Medicaid Other | Source: Ambulatory Visit | Attending: Pulmonary Disease | Admitting: Pulmonary Disease

## 2020-09-05 DIAGNOSIS — I10 Essential (primary) hypertension: Secondary | ICD-10-CM | POA: Insufficient documentation

## 2020-09-05 DIAGNOSIS — Z951 Presence of aortocoronary bypass graft: Secondary | ICD-10-CM | POA: Diagnosis present

## 2020-09-05 DIAGNOSIS — U071 COVID-19: Secondary | ICD-10-CM | POA: Diagnosis present

## 2020-09-05 DIAGNOSIS — N179 Acute kidney failure, unspecified: Secondary | ICD-10-CM | POA: Insufficient documentation

## 2020-09-05 DIAGNOSIS — F17200 Nicotine dependence, unspecified, uncomplicated: Secondary | ICD-10-CM | POA: Diagnosis not present

## 2020-09-05 MED ORDER — FAMOTIDINE IN NACL 20-0.9 MG/50ML-% IV SOLN: 20.0000 mg | Freq: Once | INTRAVENOUS | Status: DC | PRN

## 2020-09-05 MED ORDER — ALBUTEROL SULFATE HFA 108 (90 BASE) MCG/ACT IN AERS: 2.0000 | INHALATION_SPRAY | Freq: Once | RESPIRATORY_TRACT | Status: DC | PRN

## 2020-09-05 MED ORDER — SODIUM CHLORIDE 0.9 % IV SOLN: INTRAVENOUS | Status: DC | PRN

## 2020-09-05 MED ORDER — ACETAMINOPHEN 325 MG PO TABS
650.0000 mg | ORAL_TABLET | Freq: Four times a day (QID) | ORAL | Status: DC | PRN
  Administered 2020-09-05: 650 mg via ORAL
  Filled 2020-09-05: qty 2

## 2020-09-05 MED ORDER — METHYLPREDNISOLONE SODIUM SUCC 125 MG IJ SOLR: 125.0000 mg | Freq: Once | INTRAMUSCULAR | Status: DC | PRN

## 2020-09-05 MED ORDER — SODIUM CHLORIDE 0.9 % IV SOLN: Freq: Once | INTRAVENOUS | Status: AC

## 2020-09-05 MED ORDER — EPINEPHRINE 0.3 MG/0.3ML IJ SOAJ: 0.3000 mg | Freq: Once | INTRAMUSCULAR | Status: DC | PRN

## 2020-09-05 MED ORDER — DIPHENHYDRAMINE HCL 50 MG/ML IJ SOLN: 50.0000 mg | Freq: Once | INTRAMUSCULAR | Status: DC | PRN

## 2020-09-05 NOTE — Progress Notes (Signed)
  Diagnosis: COVID-19  Physician:  Dr. Patrick Wright  Procedure: [facility] Med: casirivimab\imdevimab infusion - Provided patient with casirivimab\imdevimab fact sheet for patients, parents and caregivers prior to infusion.  Complications: No immediate complications noted.  Discharge: Discharged home   Brandon Kaiser 09/05/2020   

## 2020-09-05 NOTE — Progress Notes (Signed)
Patient reviewed Fact Sheet for Patients, Parents, and Caregivers for Emergency Use Authorization (EUA) of casirivimab/imdevimab for the Treatment of Coronavirus.  Patient also reviewed and is agreeable to the estimated cost of treatment.  Patient is agreeable to proceed.  

## 2020-09-05 NOTE — Discharge Instructions (Signed)
10 Things You Can Do to Manage Your COVID-19 Symptoms at Home If you have possible or confirmed COVID-19: 1. Stay home from work and school. And stay away from other public places. If you must go out, avoid using any kind of public transportation, ridesharing, or taxis. 2. Monitor your symptoms carefully. If your symptoms get worse, call your healthcare provider immediately. 3. Get rest and stay hydrated. 4. If you have a medical appointment, call the healthcare provider ahead of time and tell them that you have or may have COVID-19. 5. For medical emergencies, call 911 and notify the dispatch personnel that you have or may have COVID-19. 6. Cover your cough and sneezes with a tissue or use the inside of your elbow. 7. Wash your hands often with soap and water for at least 20 seconds or clean your hands with an alcohol-based hand sanitizer that contains at least 60% alcohol. 8. As much as possible, stay in a specific room and away from other people in your home. Also, you should use a separate bathroom, if available. If you need to be around other people in or outside of the home, wear a mask. 9. Avoid sharing personal items with other people in your household, like dishes, towels, and bedding. 10. Clean all surfaces that are touched often, like counters, tabletops, and doorknobs. Use household cleaning sprays or wipes according to the label instructions. cdc.gov/coronavirus 03/11/2019 This information is not intended to replace advice given to you by your health care provider. Make sure you discuss any questions you have with your health care provider. Document Revised: 08/13/2019 Document Reviewed: 08/13/2019 Elsevier Patient Education  2020 [employer] What types of side effects do monoclonal antibody drugs cause?  Common side effects  In general, the more common side effects caused by monoclonal antibody drugs include: . Allergic reactions, such as hives or itching . Flu-like signs and  symptoms, including chills, fatigue, fever, and muscle aches and pains . Nausea, vomiting . Diarrhea . Skin rashes . Low blood pressure   The CDC is recommending patients who receive monoclonal antibody treatments wait at least 90 days before being vaccinated.  Currently, there are no data on the safety and efficacy of mRNA COVID-19 vaccines in persons who received monoclonal antibodies or convalescent plasma as part of COVID-19 treatment. Based on the estimated half-life of such therapies as well as evidence suggesting that reinfection is uncommon in the 90 days after initial infection, vaccination should be deferred for at least 90 days, as a precautionary measure until additional information becomes available, to avoid interference of the antibody treatment with vaccine-induced immune responses. If you have any questions or concerns after the infusion please call the Advanced Practice Provider on call at [phone]. This number is ONLY intended for your use regarding questions or concerns about the infusion post-treatment side-effects.  Please do not provide this number to others for use. For return to work notes please contact your primary care provider.   If someone you know is interested in receiving treatment please have them call the COVID hotline at [phone].   

## 2020-09-05 NOTE — Progress Notes (Deleted)
1000

## 2022-01-08 ENCOUNTER — Other Ambulatory Visit (HOSPITAL_COMMUNITY): Payer: Self-pay | Admitting: Family Medicine

## 2022-01-08 ENCOUNTER — Ambulatory Visit (HOSPITAL_COMMUNITY)
Admission: RE | Admit: 2022-01-08 | Discharge: 2022-01-08 | Disposition: A | Discharge status: Home / Self Care | Payer: Medicaid Other | Source: Ambulatory Visit | Attending: Family Medicine | Admitting: Family Medicine

## 2022-01-08 DIAGNOSIS — R059 Cough, unspecified: Secondary | ICD-10-CM | POA: Diagnosis not present

## 2022-01-23 ENCOUNTER — Other Ambulatory Visit (HOSPITAL_COMMUNITY): Payer: Self-pay | Admitting: Nurse Practitioner

## 2022-01-23 ENCOUNTER — Other Ambulatory Visit: Payer: Self-pay | Admitting: Nurse Practitioner

## 2022-01-23 DIAGNOSIS — J449 Chronic obstructive pulmonary disease, unspecified: Secondary | ICD-10-CM

## 2022-02-09 ENCOUNTER — Ambulatory Visit (HOSPITAL_COMMUNITY)
Admission: RE | Admit: 2022-02-09 | Discharge: 2022-02-09 | Disposition: A | Discharge status: Home / Self Care | Payer: Medicaid Other | Source: Ambulatory Visit | Attending: Family Medicine | Admitting: Family Medicine

## 2022-02-09 ENCOUNTER — Other Ambulatory Visit (HOSPITAL_COMMUNITY): Payer: Self-pay | Admitting: Family Medicine

## 2022-02-09 DIAGNOSIS — R059 Cough, unspecified: Secondary | ICD-10-CM | POA: Insufficient documentation

## 2022-03-29 ENCOUNTER — Encounter: Payer: Self-pay | Admitting: *Deleted

## 2023-12-25 ENCOUNTER — Other Ambulatory Visit (HOSPITAL_COMMUNITY): Payer: Self-pay | Admitting: Family Medicine

## 2023-12-25 DIAGNOSIS — F17200 Nicotine dependence, unspecified, uncomplicated: Secondary | ICD-10-CM

## 2023-12-26 ENCOUNTER — Other Ambulatory Visit (HOSPITAL_COMMUNITY): Payer: Self-pay | Admitting: Family Medicine

## 2023-12-26 DIAGNOSIS — R7989 Other specified abnormal findings of blood chemistry: Secondary | ICD-10-CM

## 2024-02-09 LAB — COLOGUARD
